# Patient Record
Sex: Male | Born: 1960 | Race: Black or African American | Hispanic: No | Marital: Single | Smoking: Current every day smoker
Health system: Southern US, Community
[De-identification: ages and names within clinical notes are randomized; demographics above are authoritative.]

## PROBLEM LIST (undated history)

## (undated) DIAGNOSIS — F32A Depression, unspecified: Secondary | ICD-10-CM

## (undated) DIAGNOSIS — K219 Gastro-esophageal reflux disease without esophagitis: Secondary | ICD-10-CM

## (undated) DIAGNOSIS — I10 Essential (primary) hypertension: Secondary | ICD-10-CM

## (undated) DIAGNOSIS — J310 Chronic rhinitis: Secondary | ICD-10-CM

## (undated) DIAGNOSIS — K117 Disturbances of salivary secretion: Secondary | ICD-10-CM

## (undated) DIAGNOSIS — F209 Schizophrenia, unspecified: Secondary | ICD-10-CM

## (undated) DIAGNOSIS — G2401 Drug induced subacute dyskinesia: Secondary | ICD-10-CM

## (undated) DIAGNOSIS — F0390 Unspecified dementia without behavioral disturbance: Secondary | ICD-10-CM

## (undated) DIAGNOSIS — R451 Restlessness and agitation: Secondary | ICD-10-CM

## (undated) DIAGNOSIS — E119 Type 2 diabetes mellitus without complications: Secondary | ICD-10-CM

## (undated) DIAGNOSIS — F329 Major depressive disorder, single episode, unspecified: Secondary | ICD-10-CM

## (undated) DIAGNOSIS — E785 Hyperlipidemia, unspecified: Secondary | ICD-10-CM

## (undated) HISTORY — DX: Gastro-esophageal reflux disease without esophagitis: K21.9

## (undated) HISTORY — DX: Major depressive disorder, single episode, unspecified: F32.9

## (undated) HISTORY — DX: Type 2 diabetes mellitus without complications: E11.9

## (undated) HISTORY — DX: Depression, unspecified: F32.A

## (undated) HISTORY — PX: APPENDECTOMY: SHX54

## (undated) HISTORY — DX: Schizophrenia, unspecified: F20.9

---

## 2009-10-26 ENCOUNTER — Encounter (HOSPITAL_COMMUNITY): Admission: RE | Admit: 2009-10-26 | Discharge: 2009-11-25 | Payer: Self-pay | Admitting: Family Medicine

## 2015-01-03 ENCOUNTER — Encounter (INDEPENDENT_AMBULATORY_CARE_PROVIDER_SITE_OTHER): Payer: Self-pay | Admitting: *Deleted

## 2015-01-12 ENCOUNTER — Ambulatory Visit (INDEPENDENT_AMBULATORY_CARE_PROVIDER_SITE_OTHER): Payer: Medicare Other | Admitting: Internal Medicine

## 2015-01-12 ENCOUNTER — Other Ambulatory Visit (INDEPENDENT_AMBULATORY_CARE_PROVIDER_SITE_OTHER): Payer: Self-pay | Admitting: Internal Medicine

## 2015-01-12 ENCOUNTER — Encounter (INDEPENDENT_AMBULATORY_CARE_PROVIDER_SITE_OTHER): Payer: Self-pay | Admitting: Internal Medicine

## 2015-01-12 ENCOUNTER — Telehealth (INDEPENDENT_AMBULATORY_CARE_PROVIDER_SITE_OTHER): Payer: Self-pay | Admitting: *Deleted

## 2015-01-12 VITALS — BP 94/60 | HR 60 | Temp 98.0°F | Ht 76.0 in | Wt 181.2 lb

## 2015-01-12 DIAGNOSIS — Z1211 Encounter for screening for malignant neoplasm of colon: Secondary | ICD-10-CM

## 2015-01-12 DIAGNOSIS — F259 Schizoaffective disorder, unspecified: Secondary | ICD-10-CM | POA: Insufficient documentation

## 2015-01-12 DIAGNOSIS — R1310 Dysphagia, unspecified: Secondary | ICD-10-CM

## 2015-01-12 DIAGNOSIS — F32A Depression, unspecified: Secondary | ICD-10-CM | POA: Insufficient documentation

## 2015-01-12 DIAGNOSIS — R634 Abnormal weight loss: Secondary | ICD-10-CM

## 2015-01-12 DIAGNOSIS — F250 Schizoaffective disorder, bipolar type: Secondary | ICD-10-CM

## 2015-01-12 DIAGNOSIS — E119 Type 2 diabetes mellitus without complications: Secondary | ICD-10-CM | POA: Insufficient documentation

## 2015-01-12 DIAGNOSIS — F329 Major depressive disorder, single episode, unspecified: Secondary | ICD-10-CM | POA: Diagnosis not present

## 2015-01-12 NOTE — Telephone Encounter (Signed)
Patient needs trilyte 

## 2015-01-12 NOTE — Patient Instructions (Signed)
EGD/ED, Colonoscopy. -The risks and benefits such as perforation, bleeding, and infection were reviewed with the patient and is agreeable. 

## 2015-01-12 NOTE — Progress Notes (Signed)
   Subjective:    Patient ID: Austin Mcdonald, male    DOB: [birth year], 54 y.o.   MRN: [id number]  HPI Referred by [facility] for dysphagia/screening colonoscopy. Patient has had weight loss. He has lost over 50 pounds in the past year per care. Patient is a resident of [facility]. He has been a resident at the home since 1984. Hx significant for schizophrenia. Patient has never undergone a colonoscopy. He says pork chops and chicken will lodge in his esophagus.  No problems eating breads. He will drink water for the bolus to go down.  His appetite is good per patient. There is no abdominal pain. He usually has a BM daily.  12/13/2014 H and H 12.7 and 39.9, MCV 93.4,platelet ct 181.  Review of Systems Past Medical History  Diagnosis Date  . Diabetes ([place])   . Schizophrenic disorder ([place])   . Depression     Past Surgical History  Procedure Laterality Date  . Appendectomy      No Known Allergies  No current outpatient prescriptions on file prior to visit.   No current facility-administered medications on file prior to visit.   Current Outpatient Prescriptions  Medication Sig Dispense Refill  . aspirin 81 MG tablet Take 81 mg by mouth daily.    . benztropine (COGENTIN) 2 MG tablet Take 2 mg by mouth 2 (two) times daily.    . citalopram (CELEXA) 20 MG tablet Take 20 mg by mouth daily.    . clonazePAM (KLONOPIN) 2 MG tablet Take 2 mg by mouth at bedtime.    . donepezil (ARICEPT) 10 MG tablet Take 10 mg by mouth at bedtime.    Marland Kitchen econazole nitrate 1 % cream Apply topically as needed.    . fluticasone (FLONASE) 50 MCG/ACT nasal spray Place into both nostrils daily.    Marland Kitchen loratadine (CLARITIN) 10 MG tablet Take 10 mg by mouth daily.    . metFORMIN (GLUCOPHAGE) 500 MG tablet Take by mouth 2 (two) times daily with a meal.    . paliperidone (INVEGA) 3 MG 24 hr tablet Take 3 mg by mouth daily.    . pravastatin (PRAVACHOL) 10 MG tablet Take 10 mg by mouth daily.     . risperidone (RISPERDAL) 4 MG tablet Take 4 mg by mouth 2 (two) times daily.    . traZODone (DESYREL) 100 MG tablet Take 100 mg by mouth at bedtime.     No current facility-administered medications for this visit.        Objective:   Physical Exam Blood pressure 94/60, pulse 60, temperature 98 F (36.7 C), height 6\' 4"  (1.93 m), weight 181 lb 3.2 oz (82.192 kg).  Alert and oriented. Skin warm and dry. Oral mucosa is moist.   . Sclera anicteric, conjunctivae is pink. Thyroid not enlarged. No cervical lymphadenopathy. Lungs clear. Heart regular rate and rhythm.  Abdomen is soft. Bowel sounds are positive. No hepatomegaly. No abdominal masses felt. No tenderness.  No edema to lower extremities.         Assessment & Plan:  Dysphagia. Stricture needs to be ruled out. Patient also has had weight loss. Screening colonoscopy

## 2015-01-16 MED ORDER — PEG 3350-KCL-NA BICARB-NACL 420 G PO SOLR: 4000.0000 mL | Freq: Once | ORAL | Status: DC

## 2015-02-23 ENCOUNTER — Encounter (HOSPITAL_COMMUNITY): Payer: Self-pay | Admitting: *Deleted

## 2015-02-23 ENCOUNTER — Encounter (HOSPITAL_COMMUNITY)
Admission: RE | Disposition: A | Discharge status: Home / Self Care | Payer: Self-pay | Source: Ambulatory Visit | Attending: Internal Medicine

## 2015-02-23 ENCOUNTER — Ambulatory Visit (HOSPITAL_COMMUNITY)
Admission: RE | Admit: 2015-02-23 | Discharge: 2015-02-23 | Disposition: A | Discharge status: Home / Self Care | Payer: Medicare Other | Source: Ambulatory Visit | Attending: Internal Medicine | Admitting: Internal Medicine

## 2015-02-23 DIAGNOSIS — Z79899 Other long term (current) drug therapy: Secondary | ICD-10-CM | POA: Insufficient documentation

## 2015-02-23 DIAGNOSIS — K2970 Gastritis, unspecified, without bleeding: Secondary | ICD-10-CM

## 2015-02-23 DIAGNOSIS — E119 Type 2 diabetes mellitus without complications: Secondary | ICD-10-CM | POA: Insufficient documentation

## 2015-02-23 DIAGNOSIS — K5730 Diverticulosis of large intestine without perforation or abscess without bleeding: Secondary | ICD-10-CM

## 2015-02-23 DIAGNOSIS — Z7951 Long term (current) use of inhaled steroids: Secondary | ICD-10-CM | POA: Insufficient documentation

## 2015-02-23 DIAGNOSIS — D120 Benign neoplasm of cecum: Secondary | ICD-10-CM | POA: Diagnosis not present

## 2015-02-23 DIAGNOSIS — K2950 Unspecified chronic gastritis without bleeding: Secondary | ICD-10-CM | POA: Insufficient documentation

## 2015-02-23 DIAGNOSIS — K648 Other hemorrhoids: Secondary | ICD-10-CM

## 2015-02-23 DIAGNOSIS — F329 Major depressive disorder, single episode, unspecified: Secondary | ICD-10-CM | POA: Insufficient documentation

## 2015-02-23 DIAGNOSIS — Z7982 Long term (current) use of aspirin: Secondary | ICD-10-CM | POA: Insufficient documentation

## 2015-02-23 DIAGNOSIS — R1310 Dysphagia, unspecified: Secondary | ICD-10-CM | POA: Insufficient documentation

## 2015-02-23 DIAGNOSIS — R634 Abnormal weight loss: Secondary | ICD-10-CM

## 2015-02-23 DIAGNOSIS — B9681 Helicobacter pylori [H. pylori] as the cause of diseases classified elsewhere: Secondary | ICD-10-CM | POA: Insufficient documentation

## 2015-02-23 DIAGNOSIS — K259 Gastric ulcer, unspecified as acute or chronic, without hemorrhage or perforation: Secondary | ICD-10-CM | POA: Insufficient documentation

## 2015-02-23 DIAGNOSIS — Z1211 Encounter for screening for malignant neoplasm of colon: Secondary | ICD-10-CM

## 2015-02-23 DIAGNOSIS — Z6821 Body mass index (BMI) 21.0-21.9, adult: Secondary | ICD-10-CM | POA: Diagnosis not present

## 2015-02-23 DIAGNOSIS — F209 Schizophrenia, unspecified: Secondary | ICD-10-CM | POA: Diagnosis not present

## 2015-02-23 DIAGNOSIS — Z7984 Long term (current) use of oral hypoglycemic drugs: Secondary | ICD-10-CM | POA: Diagnosis not present

## 2015-02-23 HISTORY — PX: COLONOSCOPY: SHX5424

## 2015-02-23 HISTORY — PX: ESOPHAGOGASTRODUODENOSCOPY: SHX5428

## 2015-02-23 HISTORY — PX: ESOPHAGEAL DILATION: SHX303

## 2015-02-23 LAB — GLUCOSE, CAPILLARY
GLUCOSE-CAPILLARY: 113 mg/dL — AB (ref 65–99)
Glucose-Capillary: 64 mg/dL — ABNORMAL LOW (ref 65–99)

## 2015-02-23 SURGERY — EGD (ESOPHAGOGASTRODUODENOSCOPY)
Anesthesia: Moderate Sedation

## 2015-02-23 MED ORDER — HYDROCORTISONE 2.5 % RE CREA: 1.0000 "application " | TOPICAL_CREAM | Freq: Two times a day (BID) | RECTAL | Status: DC

## 2015-02-23 MED ORDER — SODIUM CHLORIDE 0.9 % IV SOLN: INTRAVENOUS | Status: DC

## 2015-02-23 MED ORDER — BUTAMBEN-TETRACAINE-BENZOCAINE 2-2-14 % EX AERO
INHALATION_SPRAY | CUTANEOUS | Status: DC | PRN
  Administered 2015-02-23: 2 via TOPICAL

## 2015-02-23 MED ORDER — MEPERIDINE HCL 50 MG/ML IJ SOLN
INTRAMUSCULAR | Status: DC | PRN
  Administered 2015-02-23 (×2): 25 mg

## 2015-02-23 MED ORDER — MEPERIDINE HCL 50 MG/ML IJ SOLN
INTRAMUSCULAR | Status: AC
  Filled 2015-02-23: qty 1

## 2015-02-23 MED ORDER — STERILE WATER FOR IRRIGATION IR SOLN
Status: DC | PRN
  Administered 2015-02-23: 14:00:00

## 2015-02-23 MED ORDER — MIDAZOLAM HCL 5 MG/5ML IJ SOLN
INTRAMUSCULAR | Status: AC
  Filled 2015-02-23: qty 10

## 2015-02-23 MED ORDER — MIDAZOLAM HCL 5 MG/5ML IJ SOLN
INTRAMUSCULAR | Status: DC | PRN
  Administered 2015-02-23: 1 mg via INTRAVENOUS
  Administered 2015-02-23 (×2): 2 mg via INTRAVENOUS
  Administered 2015-02-23: 1 mg via INTRAVENOUS

## 2015-02-23 MED ORDER — PANTOPRAZOLE SODIUM 40 MG PO TBEC: 40.0000 mg | DELAYED_RELEASE_TABLET | Freq: Every day | ORAL | Status: DC

## 2015-02-23 MED ORDER — DEXTROSE-NACL 5-0.9 % IV SOLN
INTRAVENOUS | Status: DC
  Administered 2015-02-23: 1000 mL via INTRAVENOUS

## 2015-02-23 NOTE — Op Note (Signed)
EGD PROCEDURE REPORT  PATIENT:  Austin Mcdonald  MR#:  [id number] Birthdate:  September 01, 1960, 54 y.o., male Endoscopist:  Dr. Rogene Houston, MD  Procedure Date: 02/23/2015  Procedure:   EGD ED & Colonoscopy  Indications:  Patient is 54 year old African-American male with history of esophageal depression and diabetes mellitus who presents with 2 year history of intermittent solid food dysphagia and also gives history of weight loss. It is unclear weight loss is due to dysphagia or other reasons. He is also undergoing average risk screening colonoscopy.            Informed Consent:  The risks, benefits, alternatives & imponderables which include, but are not limited to, bleeding, infection, perforation, drug reaction and potential missed lesion have been reviewed.  The potential for biopsy, lesion removal, esophageal dilation, etc. have also been discussed.  Questions have been answered.  All parties agreeable.  Please see history & physical in medical record for more information.  Medications:  Demerol 50 mg IV Versed 6 mg IV Cetacaine spray topically for oropharyngeal anesthesia  EGD  Description of procedure:  The endoscope was introduced through the mouth and advanced to the second portion of the duodenum without difficulty or limitations. The mucosal surfaces were surveyed very carefully during advancement of the scope and upon withdrawal.  Findings:  Esophagus:  Mucosa of the esophagus was normal. GE junction was wide open without ring or stricture formation. GEJ:  44 cm Stomach:  There was small amount of bile in the stomach. It distended very well with insufflation. Folds in the proximal stomach were unremarkable. Examination mucosa at gastric body revealed erythema edema and multiple erosions. 4 mm ulcer noted at antrum towards greater curvature with clean base. Pyloric channel was patent. Erosions and scarring noted to mucosa at angularis. Mucosa and fundus and cardia was  normal. Duodenum:  Normal bulbar and post bulbar mucosa.  Therapeutic/Diagnostic Maneuvers Performed: Esophagus was dilated by passing 56 [place] Maloney dilator to full insertion. As the dilator was withdrawn endoscope was passed again and no disruption noted to esophageal mucosa. Multiple biopsies are taken from mucosa at gastric body and endoscope was withdrawn.   COLONOSCOPY Description of procedure:  After a digital rectal exam was performed, that colonoscope was advanced from the anus through the rectum and colon to the area of the cecum, ileocecal valve and appendiceal orifice. The cecum was deeply intubated. These structures were well-seen and photographed for the record. From the level of the cecum and ileocecal valve, the scope was slowly and cautiously withdrawn. The mucosal surfaces were carefully surveyed utilizing scope tip to flexion to facilitate fold flattening as needed. The scope was pulled down into the rectum where a thorough exam including retroflexion was performed.  Findings:   Prep satisfactory. Small cecal polyp ablated via cold biopsy. Redundant colon with small diverticulum at hepatic flexure and another one at sigmoid colon. Normal rectal mucosa. Prominent hemorrhoids noted above the dentate line along with smaller hemorrhoids below the dentate line.   Therapeutic/Diagnostic Maneuvers Performed:  See above  Complications:  None  Cecal Withdrawal Time:  9 minutes  EBL: Minimal  Impression:  EGD findings: No evidence of esophageal stricture or ring formation. Gastritis and body and antrum with multiple erosions and a small antral ulcer. Multiple biopsies taken from mucosa at gastric body for routine histology. Esophagus dilated by passing 56 [place] Maloney dilator but no disruption noted to esophageal mucosa.  Comment: Since no structural abnormality noted to esophagus, we may  be dealing with esophageal motility disorder. Need for further workup will  depend on his clinical course  Colonoscopy findings: Examination performed to cecum. Redundant colon. Small cecal polyp ablated via cold biopsy. Two small diverticula noted as above. Prominent internal hemorrhoids with small external hemorrhoids.  Recommendations:  Standard instructions given. Patient advised not to take OTC NSAIDs other than low-dose aspirin. Pantoprazole 40 mg by mouth every morning. Proctosol HC 2.5% to be applied to anal canal twice a day for 2 weeks. I will be contacting patient with results of biopsy. Follow-up in 8 weeks regarding dysphagia and weight loss.  Nikyla Navedo U  02/23/2015 3:06 PM  CC: Dr. Alison Stalling [facility] & Dr. Rayne Du ref. provider found

## 2015-02-23 NOTE — Discharge Instructions (Signed)
Resume usual medications and high fiber diet. Pantoprazole 40 mg by mouth 30 minutes before breakfast daily. Proctosol HC 2.5% cream to be applied to anal canal twice daily for 2 weeks. No driving for 24 hours. Physician will call with biopsy results. Office visit in 2 months.   Colonoscopy, Care After These instructions give you information on caring for yourself after your procedure. Your doctor may also give you more specific instructions. Call your doctor if you have any problems or questions after your procedure. HOME CARE  Do not drive for 24 hours.  Do not sign important papers or use machinery for 24 hours.  You may shower.  You may go back to your usual activities, but go slower for the first 24 hours.  Take rest breaks often during the first 24 hours.  Walk around or use warm packs on your belly (abdomen) if you have belly cramping or gas.  Drink enough fluids to keep your pee (urine) clear or pale yellow.  Resume your normal diet. Avoid heavy or fried foods.  Avoid drinking alcohol for 24 hours or as told by your doctor.  Only take medicines as told by your doctor. If a tissue sample (biopsy) was taken during the procedure:   Do not take aspirin or blood thinners for 7 days, or as told by your doctor.  Do not drink alcohol for 7 days, or as told by your doctor.  Eat soft foods for the first 24 hours. GET HELP IF: You still have a small amount of blood in your poop (stool) 2-3 days after the procedure. GET HELP RIGHT AWAY IF:  You have more than a small amount of blood in your poop.  You see clumps of tissue (blood clots) in your poop.  Your belly is puffy (swollen).  You feel sick to your stomach (nauseous) or throw up (vomit).  You have a fever.  You have belly pain that gets worse and medicine does not help. MAKE SURE YOU:  Understand these instructions.  Will watch your condition.  Will get help right away if you are not doing well or get  worse.   This information is not intended to replace advice given to you by your health care provider. Make sure you discuss any questions you have with your health care provider.   Document Released: 04/13/2010 Document Revised: 03/16/2013 Document Reviewed: 11/16/2012 Elsevier Interactive Patient Education 2016 [place]. Esophagogastroduodenoscopy, Care After Refer to this sheet in the next few weeks. These instructions provide you with information about caring for yourself after your procedure. Your health care provider may also give you more specific instructions. Your treatment has been planned according to current medical practices, but problems sometimes occur. Call your health care provider if you have any problems or questions after your procedure. WHAT TO EXPECT AFTER THE PROCEDURE After your procedure, it is typical to feel:  Soreness in your throat.  Pain with swallowing.  Sick to your stomach (nauseous).  Bloated.  Dizzy.  Fatigued. HOME CARE INSTRUCTIONS  Do not eat or drink anything until the numbing medicine (local anesthetic) has worn off and your gag reflex has returned. You will know that the local anesthetic has worn off when you can swallow comfortably.  Do not drive or operate machinery until directed by your health care provider.  Take medicines only as directed by your health care provider. SEEK MEDICAL CARE IF:   You cannot stop coughing.  You are not urinating at all or less than  usual. SEEK IMMEDIATE MEDICAL CARE IF:  You have difficulty swallowing.  You cannot eat or drink.  You have worsening throat or chest pain.  You have dizziness or lightheadedness or you faint.  You have nausea or vomiting.  You have chills.  You have a fever.  You have severe abdominal pain.  You have black, tarry, or bloody stools.   This information is not intended to replace advice given to you by your health care provider. Make sure you discuss any  questions you have with your health care provider.   Document Released: 02/26/2012 Document Revised: 04/01/2014 Document Reviewed: 02/26/2012 Elsevier Interactive Patient Education 2016 Else[employer] Inc. Esophageal Dilatation Esophageal dilatation is a procedure to open a blocked or narrowed part of the esophagus. The esophagus is the long tube in your throat that carries food and liquid from your mouth to your stomach. The procedure is also called esophageal dilation.  You may need this procedure if you have a buildup of scar tissue in your esophagus that makes it difficult, painful, or even impossible to swallow. This can be caused by gastroesophageal reflux disease (GERD). In rare cases, people need this procedure because they have cancer of the esophagus or a problem with the way food moves through the esophagus. Sometimes you may need to have another dilatation to enlarge the opening of the esophagus gradually. LET [facility] CARE PROVIDER KNOW ABOUT:   Any allergies you have.  All medicines you are taking, including vitamins, herbs, eye drops, creams, and over-the-counter medicines.  Previous problems you or members of your family have had with the use of anesthetics.  Any blood disorders you have.  Previous surgeries you have had.  Medical conditions you have.  Any antibiotic medicines you are required to take before dental procedures. RISKS AND COMPLICATIONS Generally, this is a safe procedure. However, problems can occur and include:  Bleeding from a tear in the lining of the esophagus.  A hole (perforation) in the esophagus. BEFORE THE PROCEDURE  Do not eat or drink anything after midnight on the night before the procedure or as directed by your health care provider.  Ask your health care provider about changing or stopping your regular medicines. This is especially important if you are taking diabetes medicines or blood thinners.  Plan to have someone take you home after  the procedure. PROCEDURE   You will be given a medicine that makes you relaxed and sleepy (sedative).  A medicine may be sprayed or gargled to numb the back of the throat.  Your health care provider can use various instruments to do an esophageal dilatation. During the procedure, the instrument used will be placed in your mouth and passed down into your esophagus. Options include:  Simple dilators. This instrument is carefully placed in the esophagus to stretch it.  Guided wire bougies. In this method, a flexible tube (endoscope) is used to insert a wire into the esophagus. The dilator is passed over this wire to enlarge the esophagus. Then the wire is removed.  Balloon dilators. An endoscope with a small balloon at the end is passed down into the esophagus. Inflating the balloon gently stretches the esophagus and opens it up. AFTER THE PROCEDURE  Your blood pressure, heart rate, breathing rate, and blood oxygen level will be monitored often until the medicines you were given have worn off.  Your throat may feel slightly sore and will probably still feel numb. This will improve slowly over time.  You will not be  allowed to eat or drink until the throat numbness has resolved.  If this is a same-day procedure, you may be allowed to go home once you have been able to drink, urinate, and sit on the edge of the bed without nausea or dizziness.  If this is a same-day procedure, you should have a friend or family member with you for the next 24 hours after the procedure.   This information is not intended to replace advice given to you by your health care provider. Make sure you discuss any questions you have with your health care provider.   Document Released: 05/02/2005 Document Revised: 04/01/2014 Document Reviewed: 07/21/2013 Elsevier Interactive Patient Education 2016 Elsevier Inc. High-Fiber Diet Fiber, also called dietary fiber, is a type of carbohydrate found in fruits, vegetables,  whole grains, and beans. A high-fiber diet can have many health benefits. Your health care provider may recommend a high-fiber diet to help:  Prevent constipation. Fiber can make your bowel movements more regular.  Lower your cholesterol.  Relieve hemorrhoids, uncomplicated diverticulosis, or irritable bowel syndrome.  Prevent overeating as part of a weight-loss plan.  Prevent heart disease, type 2 diabetes, and certain cancers. WHAT IS MY PLAN? The recommended daily intake of fiber includes:  38 grams for men under age 20.  49 grams for men over age 1.  27 grams for women under age 92.  38 grams for women over age 11. You can get the recommended daily intake of dietary fiber by eating a variety of fruits, vegetables, grains, and beans. Your health care provider may also recommend a fiber supplement if it is not possible to get enough fiber through your diet. WHAT DO I NEED TO KNOW ABOUT A HIGH-FIBER DIET?  Fiber supplements have not been widely studied for their effectiveness, so it is better to get fiber through food sources.  Always check the fiber content on thenutrition facts label of any prepackaged food. Look for foods that contain at least 5 grams of fiber per serving.  Ask your dietitian if you have questions about specific foods that are related to your condition, especially if those foods are not listed in the following section.  Increase your daily fiber consumption gradually. Increasing your intake of dietary fiber too quickly may cause bloating, cramping, or gas.  Drink plenty of water. Water helps you to digest fiber. WHAT FOODS CAN I EAT? Grains Whole-grain breads. Multigrain cereal. Oats and oatmeal. Brown rice. Barley. Bulgur wheat. [place]. Bran muffins. Popcorn. Rye wafer crackers. Vegetables Sweet potatoes. Spinach. Kale. Artichokes. Cabbage. Broccoli. Green peas. Carrots. Squash. Fruits Berries. Pears. Apples. Oranges. Avocados. Prunes and raisins. Dried  figs. Meats and Other Protein Sources [place], kidney, pinto, and soy beans. Split peas. Lentils. Nuts and seeds. Dairy Fiber-fortified yogurt. Beverages Fiber-fortified soy milk. Fiber-fortified orange juice. Other Fiber bars. The items listed above may not be a complete list of recommended foods or beverages. Contact your dietitian for more options. WHAT FOODS ARE NOT RECOMMENDED? Grains White bread. Pasta made with refined flour. White rice. Vegetables Fried potatoes. Canned vegetables. Well-cooked vegetables.  Fruits Fruit juice. Cooked, strained fruit. Meats and Other Protein Sources Fatty cuts of meat. Fried Sales executive or fried fish. Dairy Milk. Yogurt. Cream cheese. Sour cream. Beverages Soft drinks. Other Cakes and pastries. Butter and oils. The items listed above may not be a complete list of foods and beverages to avoid. Contact your dietitian for more information. WHAT ARE SOME TIPS FOR INCLUDING HIGH-FIBER FOODS IN MY DIET?  Eat  a wide variety of high-fiber foods.  Make sure that half of all grains consumed each day are whole grains.  Replace breads and cereals made from refined flour or white flour with whole-grain breads and cereals.  Replace white rice with brown rice, bulgur wheat, or millet.  Start the day with a breakfast that is high in fiber, such as a cereal that contains at least 5 grams of fiber per serving.  Use beans in place of meat in soups, salads, or pasta.  Eat high-fiber snacks, such as berries, raw vegetables, nuts, or popcorn.   This information is not intended to replace advice given to you by your health care provider. Make sure you discuss any questions you have with your health care provider.   Document Released: 03/11/2005 Document Revised: 04/01/2014 Document Reviewed: 08/24/2013 Elsevier Interactive Patient Education [employer].

## 2015-02-23 NOTE — H&P (Signed)
Austin Mcdonald is an 54 y.o. male.   Chief Complaint: Patient is here for EGD, ED and colonoscopy. HPI: Patient is 54 year old African-American male who presents with 2 year history of intermittent solid food dysphagia. He denies heartburn. He had few episodes of food impaction relieved with regurgitation. Most of the time he is able to push the food bolus down by drinking sips of water. He has lost several pounds this year. He is not showing weight loss is due to his dysphagia. He denies abdominal pain melena or rectal bleeding. He is undergoing diagnostic and therapeutic EGD and colonoscopy. Patient states he did undergo esophageal dilation either in [place] or [facility] few years back. Family history is negative for CRC.  Past Medical History  Diagnosis Date  . Diabetes ([place])   . Schizophrenic disorder ([place])   . Depression     Past Surgical History  Procedure Laterality Date  . Appendectomy      History reviewed. No pertinent family history. Social History:  reports that he has been smoking Cigarettes.  He has been smoking about 0.50 packs per day. He does not have any smokeless tobacco history on file. He reports that he does not drink alcohol or use illicit drugs.  Allergies: No Known Allergies  Medications Prior to Admission  Medication Sig Dispense Refill  . aspirin 81 MG tablet Take 81 mg by mouth daily.    . benztropine (COGENTIN) 2 MG tablet Take 2 mg by mouth 2 (two) times daily.    . citalopram (CELEXA) 20 MG tablet Take 20 mg by mouth daily.    . clonazePAM (KLONOPIN) 2 MG tablet Take 2 mg by mouth at bedtime.    . donepezil (ARICEPT) 10 MG tablet Take 10 mg by mouth at bedtime.    Marland Kitchen econazole nitrate 1 % cream Apply 1 application topically daily.     . fluticasone (FLONASE) 50 MCG/ACT nasal spray Place 1 spray into both nostrils daily.     Marland Kitchen loratadine (CLARITIN) 10 MG tablet Take 10 mg by mouth daily.    . metFORMIN (GLUCOPHAGE) 500 MG tablet  Take 500 mg by mouth 2 (two) times daily with a meal.     . paliperidone (INVEGA) 3 MG 24 hr tablet Take 3 mg by mouth daily.    . polyethylene glycol-electrolytes (NULYTELY/GOLYTELY) 420 G solution Take 4,000 mLs by mouth once. 4000 mL 0  . pravastatin (PRAVACHOL) 10 MG tablet Take 10 mg by mouth daily.    . risperidone (RISPERDAL) 4 MG tablet Take 4 mg by mouth 2 (two) times daily.    . traZODone (DESYREL) 100 MG tablet Take 100 mg by mouth at bedtime.      Results for orders placed or performed during the hospital encounter of 02/23/15 (from the past 48 hour(s))  Glucose, capillary     Status: Abnormal   Collection Time: 02/23/15  1:23 PM  Result Value Ref Range   Glucose-Capillary 64 (L) 65 - 99 mg/dL   No results found.  ROS  Blood pressure 112/72, pulse 62, temperature 97.6 F (36.4 C), temperature source Oral, resp. rate 12, height 6\' 2"  (1.88 m), weight 170 lb (77.111 kg), SpO2 100 %. Physical Exam  Constitutional:   Well-developed thin African-American male in no acute distress. He has speech impairment.  HENT:  Mouth/Throat: Oropharynx is clear and moist.  Eyes: Conjunctivae are normal. No scleral icterus.  Neck: No thyromegaly present.  Cardiovascular: Normal rate, regular rhythm and normal  heart sounds.   No murmur heard. Respiratory: Effort normal and breath sounds normal.  GI: Soft. He exhibits no distension. There is tenderness.  Musculoskeletal: He exhibits no edema.  Lymphadenopathy:    He has no cervical adenopathy.  Neurological: He is alert.  Skin: Skin is warm and dry.     Assessment/Plan Solid food dysphagia. Weight loss. EGD with EGD and average risk screening colonoscopy.  Austin Mcdonald U 02/23/2015, 1:55 PM

## 2015-03-01 ENCOUNTER — Encounter (HOSPITAL_COMMUNITY): Payer: Self-pay | Admitting: Internal Medicine

## 2015-03-02 ENCOUNTER — Telehealth (INDEPENDENT_AMBULATORY_CARE_PROVIDER_SITE_OTHER): Payer: Self-pay | Admitting: Internal Medicine

## 2015-03-02 MED ORDER — BIS SUBCIT-METRONID-TETRACYC 140-125-125 MG PO CAPS: 3.0000 | ORAL_CAPSULE | Freq: Three times a day (TID) | ORAL | Status: DC

## 2015-03-02 NOTE — Telephone Encounter (Signed)
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 Results reviewed with [facility] at rest home. She told me patient is able to swallow much better. Will send prescription for Pylera to his pharmacy. Next colonoscopy in 7 years.

## 2015-03-03 NOTE — Telephone Encounter (Signed)
See other note

## 2015-03-13 ENCOUNTER — Telehealth (INDEPENDENT_AMBULATORY_CARE_PROVIDER_SITE_OTHER): Payer: Self-pay | Admitting: *Deleted

## 2015-03-13 NOTE — Telephone Encounter (Signed)
Antibiotic Dr Laural Golden sent to pharmacy isn't cover by insurance, can he have something different

## 2015-03-13 NOTE — Telephone Encounter (Signed)
We have samples of Pylera. A mrs. Austin Mcdonald will come by the office 03/14/15 and pick this up for the patient.

## 2015-04-26 ENCOUNTER — Ambulatory Visit (INDEPENDENT_AMBULATORY_CARE_PROVIDER_SITE_OTHER): Payer: Medicare Other | Admitting: Internal Medicine

## 2015-05-10 ENCOUNTER — Ambulatory Visit (INDEPENDENT_AMBULATORY_CARE_PROVIDER_SITE_OTHER): Payer: Medicare Other | Admitting: Internal Medicine

## 2015-05-10 ENCOUNTER — Encounter (INDEPENDENT_AMBULATORY_CARE_PROVIDER_SITE_OTHER): Payer: Self-pay | Admitting: Internal Medicine

## 2015-05-10 VITALS — BP 112/78 | HR 64 | Temp 98.2°F | Ht 76.0 in | Wt 180.8 lb

## 2015-05-10 DIAGNOSIS — R1319 Other dysphagia: Secondary | ICD-10-CM

## 2015-05-10 DIAGNOSIS — B9681 Helicobacter pylori [H. pylori] as the cause of diseases classified elsewhere: Secondary | ICD-10-CM

## 2015-05-10 DIAGNOSIS — D369 Benign neoplasm, unspecified site: Secondary | ICD-10-CM

## 2015-05-10 DIAGNOSIS — A048 Other specified bacterial intestinal infections: Secondary | ICD-10-CM

## 2015-05-10 NOTE — Progress Notes (Signed)
Subjective:    Patient ID: Austin Mcdonald, male    DOB: November 26, 1960, 55 y.o.   MRN: [id number]  HPI Here today for f/u after undergoing and EGD/ED in December for dysphagia. He also underwent a screening colonoscopy. Found to be H. Pylori positive and covered with Pylera.  He is a resident of a group home.  Caregiver says when he eats it is noisy. He says he can eat anything he wants now.  He says his swallowing is better. He does have problems eating pork chops and ribs.  His appetite is good. He has maintained his weight. Last weight in October was 1890. Today his weight is 180.8.  His BMs are normal he has one stool a day.     02/23/2015 EGD ED & Colonoscopy  Indications: Patient is 55 year old African-American male with history of esophageal depression and diabetes mellitus who presents with 2 year history of intermittent solid food dysphagia and also gives history of weight loss. It is unclear weight loss is due to dysphagia or other reasons. He is also undergoing average risk screening colonoscopy. EGD findings: No evidence of esophageal stricture or ring formation. Gastritis and body and antrum with multiple erosions and a small antral ulcer. Multiple biopsies taken from mucosa at gastric body for routine histology. Esophagus dilated by passing 56 French Maloney dilator but no disruption noted to esophageal mucosa.  Comment: Since no structural abnormality noted to esophagus, we may be dealing with esophageal motility disorder. Need for further workup will depend on his clinical course  Colonoscopy findings: Examination performed to cecum. Redundant colon. Small cecal polyp ablated via cold biopsy. Two small diverticula noted as above. Prominent internal hemorrhoids with small external hemorrhoids. Biopsy: Tubular adenoma.   Biopsy results reviewed with  Everlene Farrier at rest home. Patient is able to swallow better. Will treat H. pylori infection with Pylera for 10 days. Patient is on pantoprazole. Next colonoscopy in 7 years.  Review of Systems Past Medical History  Diagnosis Date  . Diabetes ([place])   . Schizophrenic disorder ([place])   . Depression     Past Surgical History  Procedure Laterality Date  . Appendectomy    . Esophagogastroduodenoscopy N/A 02/23/2015    Procedure: ESOPHAGOGASTRODUODENOSCOPY (EGD);  Surgeon: Rogene Houston, MD;  [facility];  Service: Endoscopy;  Laterality: N/A;  2:00  . Esophageal dilation N/A 02/23/2015    Procedure: ESOPHAGEAL DILATION;  Surgeon: Rogene Houston, MD;  [facility]: [facility];  Service: Endoscopy;  Laterality: N/A;  . Colonoscopy N/A 02/23/2015    Procedure: COLONOSCOPY;  Surgeon: Rogene Houston, MD;  [facility]: [facility];  Service: Endoscopy;  Laterality: N/A;    No Known Allergies  Current Outpatient Prescriptions on File Prior to Visit  Medication Sig Dispense Refill  . aspirin 81 MG tablet Take 81 mg by mouth daily.    . benztropine (COGENTIN) 2 MG tablet Take 2 mg by mouth 2 (two) times daily.    . citalopram (CELEXA) 20 MG tablet Take 20 mg by mouth daily.    . clonazePAM (KLONOPIN) 2 MG tablet Take 2 mg by mouth at bedtime.    . donepezil (ARICEPT) 10 MG tablet Take 10 mg by mouth at bedtime.    Marland Kitchen econazole nitrate 1 % cream Apply 1 application topically daily.     . fluticasone (FLONASE) 50 MCG/ACT nasal spray Place 1 spray into both nostrils daily.     Marland Kitchen loratadine (CLARITIN) 10 MG tablet Take 10  mg by mouth daily.    . metFORMIN (GLUCOPHAGE) 500 MG tablet Take 500 mg by mouth 2 (two) times daily with a meal.     . paliperidone (INVEGA) 3 MG 24 hr tablet Take 3 mg by mouth daily.    . pantoprazole (PROTONIX) 40 MG tablet Take 1 tablet (40 mg total) by mouth daily before breakfast. 30 tablet 5  . pravastatin (PRAVACHOL) 10 MG tablet Take 10 mg by mouth daily.    .  risperidone (RISPERDAL) 4 MG tablet Take 4 mg by mouth 2 (two) times daily.    . traZODone (DESYREL) 100 MG tablet Take 100 mg by mouth at bedtime.     No current facility-administered medications on file prior to visit.        Objective:   Physical Exam Blood pressure 112/78, pulse 64, temperature 98.2 F (36.8 C), height 6\' 4"  (1.93 m), weight 180 lb 12.8 oz (82.01 kg).  Alert and oriented. Skin warm and dry. Oral mucosa is moist.   . Sclera anicteric, conjunctivae is pink. Thyroid not enlarged. No cervical lymphadenopathy. Lungs clear. Heart regular rate and rhythm.  Abdomen is soft. Bowel sounds are positive. No hepatomegaly. No abdominal masses felt. No tenderness.  No edema to lower extremities.          Assessment & Plan:  Dysphagia. His swallowing is much better. He does have some problems with pork chops and ribs. He will continue the Protonix. OV in 1 year.

## 2015-05-10 NOTE — Patient Instructions (Signed)
He will continue the Protonix.  OV 1 year.

## 2015-06-27 ENCOUNTER — Other Ambulatory Visit (INDEPENDENT_AMBULATORY_CARE_PROVIDER_SITE_OTHER): Payer: Self-pay | Admitting: Internal Medicine

## 2016-03-29 ENCOUNTER — Encounter (INDEPENDENT_AMBULATORY_CARE_PROVIDER_SITE_OTHER): Payer: Self-pay

## 2016-03-29 ENCOUNTER — Encounter (INDEPENDENT_AMBULATORY_CARE_PROVIDER_SITE_OTHER): Payer: Self-pay | Admitting: Internal Medicine

## 2016-05-09 ENCOUNTER — Encounter (INDEPENDENT_AMBULATORY_CARE_PROVIDER_SITE_OTHER): Payer: Self-pay | Admitting: Internal Medicine

## 2016-05-09 ENCOUNTER — Ambulatory Visit (INDEPENDENT_AMBULATORY_CARE_PROVIDER_SITE_OTHER): Payer: Medicare Other | Admitting: Internal Medicine

## 2016-05-09 DIAGNOSIS — K219 Gastro-esophageal reflux disease without esophagitis: Secondary | ICD-10-CM | POA: Insufficient documentation

## 2016-05-09 HISTORY — DX: Gastro-esophageal reflux disease without esophagitis: K21.9

## 2016-05-09 NOTE — Patient Instructions (Signed)
OV in 1 year.  

## 2016-05-09 NOTE — Progress Notes (Signed)
Subjective:    Patient ID: Austin Mcdonald, male    DOB: [birth year], 56 y.o.   MRN: [id number]  HPI  Last weight in February 2017 180 Here today for f/u. Underwent an EGD/ED in December of 2016 for dysphagia. Also underwent a screening colonoscopy.  He was found to be H. pylori positive and covered with Pylera. Resident of a group home. He tells me he is doing alright. Appetite is good.  He has gained about 10 pounds since his last visit.  Does have some problems sometimes eating pork chops.  He chews his food well.  Heart burn controlled with Protonix.  He has a BM x 1 a day and sometimes 2.  No melena or BRRB. He is exercising by walking in yard.     02/23/2015   EGD ED & Colonoscopy   Indications:  Patient is 56 year old African-American male with history of esophageal depression and diabetes mellitus who presents with 2 year history of intermittent solid food dysphagia and also gives history of weight loss. It is unclear weight loss is due to dysphagia or other reasons. He is also undergoing average risk screening colonoscopy.                                                                                                                         EGD findings: No evidence of esophageal stricture or ring formation. Gastritis and body and antrum with multiple erosions and a small antral ulcer. Multiple biopsies taken from mucosa at gastric body for routine histology. Esophagus dilated by passing 56 French Maloney dilator but no disruption noted to esophageal mucosa.  Biopsy: Chronic gastritis with H. pylori and intestinal metaplasia. No dysplasia or malignancy,.  Colonoscopy:   Small cecal polyp ablated via cold biopsy. Redundant colon with small diverticulum at hepatic flexure and another one at sigmoid colon. Normal rectal mucosa. Prominent hemorrhoids noted above the dentate line along with smaller hemorrhoids below the dentate line. Biopsy: Tubular adenoma; No high grade dysplasia  or malignancy    Review of Systems Past Medical History:  Diagnosis Date  . Depression   . Diabetes ([place])   . GERD (gastroesophageal reflux disease) 05/09/2016  . Schizophrenic disorder [facility])     Past Surgical History:  Procedure Laterality Date  . APPENDECTOMY    . COLONOSCOPY N/A 02/23/2015   Procedure: COLONOSCOPY;  Surgeon: Rogene Houston, MD;  [facility];  Service: Endoscopy;  Laterality: N/A;  . ESOPHAGEAL DILATION N/A 02/23/2015   Procedure: ESOPHAGEAL DILATION;  Surgeon: Rogene Houston, MD;  [facility];  Service: Endoscopy;  Laterality: N/A;  . ESOPHAGOGASTRODUODENOSCOPY N/A 02/23/2015   Procedure: ESOPHAGOGASTRODUODENOSCOPY (EGD);  Surgeon: Rogene Houston, MD;  [facility];  Service: Endoscopy;  Laterality: N/A;  2:00    No Known Allergies  Current Outpatient Prescriptions on File Prior to Visit  Medication Sig Dispense Refill  . aspirin 81 MG tablet Take 81 mg by mouth daily.    Marland Kitchen  benztropine (COGENTIN) 2 MG tablet Take 2 mg by mouth 2 (two) times daily.    . citalopram (CELEXA) 20 MG tablet Take 20 mg by mouth daily.    . clonazePAM (KLONOPIN) 2 MG tablet Take 2 mg by mouth at bedtime.    . donepezil (ARICEPT) 10 MG tablet Take 10 mg by mouth at bedtime.    Marland Kitchen econazole nitrate 1 % cream Apply 1 application topically daily.     . fluticasone (FLONASE) 50 MCG/ACT nasal spray Place 1 spray into both nostrils daily.     Marland Kitchen loratadine (CLARITIN) 10 MG tablet Take 10 mg by mouth daily.    . metFORMIN (GLUCOPHAGE) 500 MG tablet Take 500 mg by mouth 2 (two) times daily with a meal.     . paliperidone (INVEGA) 3 MG 24 hr tablet Take 3 mg by mouth daily.    . pantoprazole (PROTONIX) 40 MG tablet TAKE 1 TABLET IN THE MORNING BEFORE BREAKFAST 28 tablet 5  . pravastatin (PRAVACHOL) 10 MG tablet Take 10 mg by mouth daily.    . risperidone (RISPERDAL) 4 MG tablet Take 4 mg by mouth 2 (two) times daily.    . traZODone (DESYREL) 100 MG tablet  Take 100 mg by mouth at bedtime.     No current facility-administered medications on file prior to visit.        Objective:   Physical Exam Blood pressure 130/90, pulse 60, temperature 98.5 F (36.9 C), height 6' (1.829 m), weight 190 lb 11.2 oz (86.5 kg). Alert and oriented. Skin warm and dry. Oral mucosa is moist.   . Sclera anicteric, conjunctivae is pink. Thyroid not enlarged. No cervical lymphadenopathy. Lungs clear. Heart regular rate and rhythm.  Abdomen is soft. Bowel sounds are positive. No hepatomegaly. No abdominal masses felt. No tenderness.  No edema to lower extremities.          Assessment & Plan:  Dysphagia. No trouble swallowing. GERD controlled with Protonix. OV in 1 year.

## 2016-07-08 ENCOUNTER — Encounter (INDEPENDENT_AMBULATORY_CARE_PROVIDER_SITE_OTHER): Payer: Self-pay

## 2016-07-08 ENCOUNTER — Ambulatory Visit (INDEPENDENT_AMBULATORY_CARE_PROVIDER_SITE_OTHER): Payer: Medicare Other | Admitting: Internal Medicine

## 2016-07-08 ENCOUNTER — Encounter (INDEPENDENT_AMBULATORY_CARE_PROVIDER_SITE_OTHER): Payer: Self-pay | Admitting: Internal Medicine

## 2016-07-08 ENCOUNTER — Encounter (INDEPENDENT_AMBULATORY_CARE_PROVIDER_SITE_OTHER): Payer: Self-pay | Admitting: *Deleted

## 2016-07-08 VITALS — BP 124/72 | HR 76 | Temp 98.2°F | Ht 72.0 in | Wt 190.8 lb

## 2016-07-08 DIAGNOSIS — R197 Diarrhea, unspecified: Secondary | ICD-10-CM | POA: Diagnosis not present

## 2016-07-08 DIAGNOSIS — R1310 Dysphagia, unspecified: Secondary | ICD-10-CM | POA: Diagnosis not present

## 2016-07-08 DIAGNOSIS — R1319 Other dysphagia: Secondary | ICD-10-CM

## 2016-07-08 NOTE — Patient Instructions (Addendum)
GI pathogen.  Imodium twice a day as needed. Esophagram.

## 2016-07-08 NOTE — Progress Notes (Signed)
   Subjective:    Patient ID: Austin Mcdonald, male    DOB: 24-Apr-1960, 56 y.o.   MRN: [id number]  HPI Here today with c/o incontinence. Last seen in February of this year for f/u.  Caregiver states patient has been having loose stools and has been incontinent at times. Sometimes if he eats meats, it is slow to go down. Caregiver states his stools are usually like water. She has not tried anything for the diarrhea.  Has not been on any recent antibiotic. He is having 1-2 BMs a day. He is incontinent about 1-2 times a week. Symptoms for about a month. No new medications.  Appetite is good. No weight loss.  No abdominal pain. No melena or BRRB.    Underwent an EGD in December of 2016 for dysphagia and a screening colonoscopy.    02/23/2015 EGD ED &Colonoscopy  Indications: Patient is 56 year old African-American male with history of esophageal depression and diabetes mellitus who presents with 2 year history of intermittent solid food dysphagia and also gives history of weight loss. It is unclear weight loss is due to dysphagia or other reasons. He is also undergoing average risk screening colonoscopy. EGD findings: No evidence of esophageal stricture or ring formation. Gastritis and body and antrum with multiple erosions and a small antral ulcer. Multiple biopsies taken from mucosa at gastric body for routine histology. Esophagus dilated by passing 56 French Maloney dilator but no disruption noted to esophageal mucosa.  Biopsy: Chronic gastritis with H. pylori and intestinal metaplasia. No dysplasia or malignancy,.  Colonoscopy:   Small cecal polyp ablated via cold biopsy. Redundant colon with small diverticulum at hepatic flexure and another one at sigmoid colon. Normal rectal mucosa. Prominent hemorrhoids noted above the dentate line along with smaller hemorrhoids  below the dentate line. Biopsy: Tubular adenoma; No high grade dysplasia or malignancy  Review of Systems     Objective:   Physical Exam  Vitals:   07/08/16 1534  Weight: 190 lb 12.8 oz (86.5 kg)  Height: 6' (1.829 m)   Alert and oriented. Skin warm and dry. Oral mucosa is moist.   . Sclera anicteric, conjunctivae is pink. Thyroid not enlarged. No cervical lymphadenopathy. Lungs clear. Heart regular rate and rhythm.  Abdomen is soft. Bowel sounds are positive. No hepatomegaly. No abdominal masses felt. No tenderness.  No edema to lower extremities.          Assessment & Plan:  Diarrhea. Incontience. GI pathogen. Imodium once in am and one in pm.  Dysphagia: Will get a DG esophagram.

## 2016-07-11 LAB — GASTROINTESTINAL PATHOGEN PANEL PCR
C. difficile Tox A/B, PCR: NOT DETECTED
CAMPYLOBACTER, PCR: NOT DETECTED
CRYPTOSPORIDIUM, PCR: NOT DETECTED
E coli (ETEC) LT/ST PCR: NOT DETECTED
E coli (STEC) stx1/stx2, PCR: NOT DETECTED
E coli 0157, PCR: NOT DETECTED
GIARDIA LAMBLIA, PCR: NOT DETECTED
NOROVIRUS, PCR: NOT DETECTED
Rotavirus A, PCR: NOT DETECTED
SHIGELLA, PCR: NOT DETECTED
Salmonella, PCR: NOT DETECTED

## 2016-07-17 ENCOUNTER — Ambulatory Visit (HOSPITAL_COMMUNITY)
Admission: RE | Admit: 2016-07-17 | Discharge: 2016-07-17 | Disposition: A | Discharge status: Home / Self Care | Payer: Medicare Other | Source: Ambulatory Visit | Attending: Internal Medicine | Admitting: Internal Medicine

## 2016-07-17 DIAGNOSIS — K224 Dyskinesia of esophagus: Secondary | ICD-10-CM | POA: Diagnosis not present

## 2016-07-17 DIAGNOSIS — R1310 Dysphagia, unspecified: Secondary | ICD-10-CM

## 2016-07-17 DIAGNOSIS — R1319 Other dysphagia: Secondary | ICD-10-CM

## 2017-04-03 ENCOUNTER — Encounter (INDEPENDENT_AMBULATORY_CARE_PROVIDER_SITE_OTHER): Payer: Self-pay | Admitting: Internal Medicine

## 2017-05-09 ENCOUNTER — Ambulatory Visit (INDEPENDENT_AMBULATORY_CARE_PROVIDER_SITE_OTHER): Payer: Medicare Other | Admitting: Internal Medicine

## 2017-05-09 ENCOUNTER — Encounter (INDEPENDENT_AMBULATORY_CARE_PROVIDER_SITE_OTHER): Payer: Self-pay | Admitting: Internal Medicine

## 2017-05-09 VITALS — BP 138/68 | HR 60 | Temp 97.6°F | Ht 74.0 in | Wt 199.2 lb

## 2017-05-09 DIAGNOSIS — R1319 Other dysphagia: Secondary | ICD-10-CM

## 2017-05-09 DIAGNOSIS — R1310 Dysphagia, unspecified: Secondary | ICD-10-CM | POA: Insufficient documentation

## 2017-05-09 DIAGNOSIS — R197 Diarrhea, unspecified: Secondary | ICD-10-CM | POA: Diagnosis not present

## 2017-05-09 NOTE — Patient Instructions (Signed)
Imodium BID as needed. Make sure u chew your foods well.

## 2017-05-09 NOTE — Progress Notes (Signed)
   Subjective:    Patient ID: Austin Mcdonald, male    DOB: January 09, 1961, 57 y.o.   MRN: [id number] Resident of [facility] HPI Here today for f/u. Last seen in April of 2018 with diarrhea. GI pathogen was negative. He was advised to take Imodium BID His stools were better after starting the Imodium.  Also underwent an Esophagram which revealed IMPRESSION: Laryngeal penetration without aspiration.Minimal piriform sinus residuals. Diffuse esophageal dysmotility. When he drinks sodas, he has diarrhea. He is having a BM every time he buys a sodas.  If he doesn't drink sodas, his BMs are normal. Caregiver verifies this. His appetite is good. He has gained 9 pounds since his OV in April.  He states he is not having dysphagia.    In December of 2016 underwent and EGD/Colonoscopy (dysphagia, wt loss) EGD:   Impression:  EGD findings: No evidence of esophageal stricture or ring formation. Gastritis and body and antrum with multiple erosions and a small antral ulcer. Multiple biopsies taken from mucosa at gastric body for routine histology. Esophagus dilated by passing 56 French Maloney dilator but no disruption noted to esophageal mucosa. H. Pylori positive on EGD  Treated H. pylori infection with Pylera for 10 days. Patient is on pantoprazole.   Colonoscopy findings: Examination performed to cecum. Redundant colon. Small cecal polyp ablated via cold biopsy. Two small diverticula noted as above. Prominent internal hemorrhoids with small external hemorrhoids   Review of Systems     Objective:   Physical Exam Blood pressure 138/68, pulse 60, temperature 97.6 F (36.4 C), height 6\' 2"  (1.88 m), weight 199 lb 3.2 oz (90.4 kg). Alert and oriented. Skin warm and dry. Oral mucosa is moist.   . Sclera anicteric, conjunctivae is pink. Thyroid not enlarged. No cervical lymphadenopathy. Lungs clear. Heart regular rate and rhythm.  Abdomen is soft. Bowel sounds are positive. No  hepatomegaly. No abdominal masses felt. No tenderness.  No edema to lower extremities.           Assessment & Plan:  Diarrhea. Patient needs to stop drinking sodas. May taking BID as needed.  Dysphagia: Chew foods well. OV in 1 year.

## 2017-07-21 NOTE — Progress Notes (Signed)
Psychiatric Initial Adult Assessment   Patient Identification: Austin Mcdonald MRN:  [id number] Date of Evaluation:  07/22/2017 Referral Source: [facility] Chief Complaint:   Chief Complaint    Psychiatric Evaluation     Visit Diagnosis:    ICD-10-CM   1. Schizophrenia, unspecified type ([place]) F20.9     History of Present Illness:   Austin Mcdonald is a 57 y.o. year old male with a history of schizophrenia, tardive dyskinesia, hyperlipidemia, diabetes, who is referred for schizophrenia.   Reviewed note from [facility]. Note includes [id number]. Diagnosis includes schizophrenia in partial remission, GAD. Psychiatry office note; not legible.   The majority of the story is provided by Ms. Everlene Farrier, who is a care giver at home.  He states that he has been feeling good. When he is asked his typical day, he states that he makes up his room. He feels irritated by his roommate who gets on his nerves. He states that this person continues talking as he does not have education (Per Ms. Everlene Farrier, they get along well). He feels anxious at times when his family does not visit him as often. He does not recall the last time he met them, although they visited him on Thanksgiving per Ms. Everlene Farrier. 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 Everlene Farrier) and had worked a variety of job previously.   He feels depressed at times. He has insomnia. He has good appetite. He reports that there is "always a way out" and giggles when he is asked SI, although he denies any SI afterwards. He feels anxious at times. When he is asked to elaborate it, he states that "I go out, discharged, then no problem." He denies AH, VH, paranoia. He used to drink two fifths of liquor, 20 years ago. He denies drug use.   Per Ms. Verlene Mayer,  Patient is at family care home for many years. He has been on current medication for more than ten years, and it works well for him. Although he occasionally gets irritable, he has not  had any incident more than ten years when he was committed to [facility] for some behavior (details unknown). He makes false Press photographer of not providing mails to him. 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 He talks to himself at times. He paces at night when he has insomnia.   Per PMP,  Clonazepam last filled on 06/09/2017  I have utilized the [place] Controlled Substances Reporting System (PMP AWARxE) to confirm adherence regarding the patient's medication. My review reveals appropriate prescription fills.   Associated Signs/Symptoms: Depression Symptoms:  depressed mood, (Hypo) Manic Symptoms:  denies decreased need for sleep, euphoria Anxiety Symptoms:  denies Psychotic Symptoms:  denies, but the staff reports he is talking to himself PTSD Symptoms: Negative  Past Psychiatric History:  Outpatient:  [facility] (tw[facility] per patient) Previous suicide attempt: denies  Past trials of medication: thorazine, haldol, olanzapine,  History of violence: committed for irritability more than 15 years ago   Previous Psychotropic Medications: Yes   Substance Abuse History in the last 12 months:  No.  Consequences of Substance Abuse: NA  Past Medical History:  Past Medical History:  Diagnosis Date  . Depression   . Diabetes ([place])   . GERD (gastroesophageal reflux disease) 05/09/2016  . Schizophrenic disorder [facility])     Past Surgical History:  Procedure Laterality Date  . APPENDECTOMY    . COLONOSCOPY  N/A 02/23/2015   Procedure: COLONOSCOPY;  Surgeon: Rogene Houston, MD;  [facility];  Service: Endoscopy;  Laterality: N/A;  . ESOPHAGEAL DILATION N/A 02/23/2015   Procedure: ESOPHAGEAL DILATION;  Surgeon: Rogene Houston, MD;  [facility];  Service: Endoscopy;  Laterality: N/A;  . ESOPHAGOGASTRODUODENOSCOPY N/A 02/23/2015   Procedure: ESOPHAGOGASTRODUODENOSCOPY (EGD);  Surgeon: Rogene Houston, MD;   [facility];  Service: Endoscopy;  Laterality: N/A;  2:00    Family Psychiatric History: unknown  Family History: No family history on file.  Social History:   Social History   Socioeconomic History  . Marital status: Single    Spouse name: Not on file  . Number of children: Not on file  . Years of education: Not on file  . Highest education level: Not on file  Occupational History  . Not on file  Social Needs  . Financial resource strain: Not on file  . Food insecurity:    Worry: Not on file    Inability: Not on file  . Transportation needs:    Medical: Not on file    Non-medical: Not on file  Tobacco Use  . Smoking status: Current Every Day Smoker    Packs/day: 0.50    Types: Cigarettes  . Smokeless tobacco: Never Used  . Tobacco comment: 10 cigarettes a day  Substance and Sexual Activity  . Alcohol use: No    Alcohol/week: 0.0 oz  . Drug use: No  . Sexual activity: Not on file  Lifestyle  . Physical activity:    Days per week: Not on file    Minutes per session: Not on file  . Stress: Not on file  Relationships  . Social connections:    Talks on phone: Not on file    Gets together: Not on file    Attends religious service: Not on file    Active member of club or organization: Not on file    Attends meetings of clubs or organizations: Not on file    Relationship status: Not on file  Other Topics Concern  . Not on file  Social History Narrative  . 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 acetaminophen (TYLENOL) 500 MG  tablet Take 500 mg by mouth every 6 (six) hours as needed.    Marland Kitchen aspirin 81 MG tablet Take 81 mg by mouth daily.    . benztropine (COGENTIN) 2 MG tablet Take 1 tablet (2 mg total) by mouth 2 (two) times daily. 180 tablet 0  . citalopram (CELEXA) 20 MG tablet Take 20 mg by mouth daily.    . clonazePAM (KLONOPIN) 2 MG tablet Take 1 tablet (2 mg total) by mouth at bedtime. 90 tablet 0  . donepezil (ARICEPT) 10 MG tablet Take 1 tablet (10 mg total) by mouth at bedtime. 90 tablet 0  . econazole nitrate 1 % cream Apply 1 application topically daily.     Marland Kitchen ipratropium (ATROVENT) 0.03 % nasal spray Place 2 sprays into both nostrils every 12 (twelve) hours.    Marland Kitchen loratadine (CLARITIN) 10 MG  tablet Take 10 mg by mouth daily.    . meloxicam (MOBIC) 15 MG tablet Take 15 mg by mouth daily.    . metFORMIN (GLUCOPHAGE) 500 MG tablet Take 500 mg by mouth 2 (two) times daily with a meal.     . paliperidone (INVEGA) 3 MG 24 hr tablet Take 1 tablet (3 mg total) by mouth daily. 90 tablet 0  . pantoprazole (PROTONIX) 40 MG tablet TAKE 1 TABLET IN THE MORNING BEFORE BREAKFAST 28 tablet 5  . pravastatin (PRAVACHOL) 10 MG tablet Take 10 mg by mouth daily.    . risperidone (RISPERDAL) 4 MG tablet Take 1 tablet (4 mg total) by mouth 2 (two) times daily. 180 tablet 0  . traZODone (DESYREL) 100 MG tablet Take 100 mg by mouth at bedtime.     No current facility-administered medications for this visit.     Neurologic: Headache: No Seizure: No Paresthesias:No  Musculoskeletal: Strength & Muscle Tone: within normal limits Gait & Station: normal Patient leans: N/A  Psychiatric Specialty Exam: Review of Systems  Neurological: Positive for speech change.  Psychiatric/Behavioral: Positive for hallucinations and memory loss. Negative for depression, substance abuse and suicidal ideas. The patient is nervous/anxious and has insomnia.   All other systems reviewed and are negative.   Blood pressure 119/64, pulse (!) 53,  height _0  (1.88 m), weight 199 lb (90.3 kg), SpO2 99 %.Body mass index is 25.55 kg/m.  General Appearance: Fairly Groomed  Eye Contact:  Minimal  Speech:  Garbled  Volume:  Normal  Mood:  Anxious  Affect:  Non-Congruent  Thought Process:  Disorganized, tangential  Orientation:  Full (Time, Place, and Person)  Thought Content:  Delusions  Suicidal Thoughts:  No  Homicidal Thoughts:  No  Memory:  Immediate;   Fair  Judgement:  Fair  Insight:  Lacking  Psychomotor Activity:  Normal  Concentration:  Concentration: Good and Attention Span: Good  Recall:  Good  Fund of Knowledge:Good  Language: Good  Akathisia:  No  Handed:  Right  AIMS (if indicated):  Occasional resting tremors. No cogwheel rigidity  Assets:  Communication Skills Desire for Improvement  ADL's:  Intact  Cognition: Impaired,  Mild  Sleep:  poor   Assessment Austin Mcdonald is a 57 y.o. year old male with a history of schizophrenia, tardive dyskinesia, hyperlipidemia, diabetes, who is referred for schizophrenia.   # Schizophrenia Exam is notable for dysarthria, disorganization, delusion, and have some incongruent affect. The staff denies significant behavioral issues except mild chronic irritability and responding to internal stimuli. Although it is preferable to be on one antipsychotics, will continue current regimen as he is mostly stable on current medication for many years per report. Will continue paliperidone and risperidone for schizophrenia.  Discussed metabolic side effect and EPS.  Will continue Cogentin for EPS.  Will continue citalopram for anxiety.  Will continue clonazepam for anxiety. Will consider ingrezza at the next visit for tardive dyskinesia.   # r/o mild neurocognitive impairment Will consider MOCA at the next visit. Will continue donepezil for cognitive impairment.   Plan 1. Continue Paliperidone 3 mg AM 2. Continue Risperidone 4 mg BID 3. Continue cogentin 2 mg BID 4. Continue citalopram  20 mg daily  5. Continue donepezil 10 mg daily 6. Continue clonazepam 2 mg qhs 7. Obtain record from [facility] (check prolactin level if indicated) 8. Return to clinic in three months for 30 mins - Consider checking TSH, vitamin B 12, folate if not  checked  The patient demonstrates the following risk factors for suicide: Chronic risk factors for suicide include: psychiatric disorder of schizophrenia. Acute risk factors for suicide include: unemployment. Protective factors for this patient include: positive social support. Considering these factors, the overall suicide risk at this point appears to be low. Patient is appropriate for outpatient follow up.   Treatment Plan Summary: Plan as above   [name], MD 4/30/201912:00 PM

## 2017-07-22 ENCOUNTER — Ambulatory Visit (INDEPENDENT_AMBULATORY_CARE_PROVIDER_SITE_OTHER): Payer: Medicare Other | Admitting: Psychiatry

## 2017-07-22 ENCOUNTER — Telehealth (HOSPITAL_COMMUNITY): Payer: Self-pay

## 2017-07-22 ENCOUNTER — Encounter (HOSPITAL_COMMUNITY): Payer: Self-pay | Admitting: Psychiatry

## 2017-07-22 ENCOUNTER — Encounter (INDEPENDENT_AMBULATORY_CARE_PROVIDER_SITE_OTHER): Payer: Self-pay

## 2017-07-22 ENCOUNTER — Other Ambulatory Visit (HOSPITAL_COMMUNITY): Payer: Self-pay | Admitting: Psychiatry

## 2017-07-22 VITALS — BP 119/64 | HR 53 | Ht 74.0 in | Wt 199.0 lb

## 2017-07-22 DIAGNOSIS — F419 Anxiety disorder, unspecified: Secondary | ICD-10-CM | POA: Diagnosis not present

## 2017-07-22 DIAGNOSIS — F17210 Nicotine dependence, cigarettes, uncomplicated: Secondary | ICD-10-CM

## 2017-07-22 DIAGNOSIS — G4700 Insomnia, unspecified: Secondary | ICD-10-CM | POA: Diagnosis not present

## 2017-07-22 DIAGNOSIS — R450 Nervousness: Secondary | ICD-10-CM

## 2017-07-22 DIAGNOSIS — R413 Other amnesia: Secondary | ICD-10-CM | POA: Diagnosis not present

## 2017-07-22 DIAGNOSIS — F209 Schizophrenia, unspecified: Secondary | ICD-10-CM | POA: Diagnosis not present

## 2017-07-22 MED ORDER — CLONAZEPAM 2 MG PO TABS: 2.0000 mg | ORAL_TABLET | Freq: Every day | ORAL | 0 refills | Status: DC

## 2017-07-22 MED ORDER — PALIPERIDONE ER 3 MG PO TB24: 3.0000 mg | ORAL_TABLET | Freq: Every day | ORAL | 0 refills | Status: DC

## 2017-07-22 MED ORDER — TRAZODONE HCL 100 MG PO TABS: 100.0000 mg | ORAL_TABLET | Freq: Every day | ORAL | 0 refills | Status: DC

## 2017-07-22 MED ORDER — DONEPEZIL HCL 10 MG PO TABS: 10.0000 mg | ORAL_TABLET | Freq: Every day | ORAL | 0 refills | Status: DC

## 2017-07-22 MED ORDER — BENZTROPINE MESYLATE 2 MG PO TABS: 2.0000 mg | ORAL_TABLET | Freq: Two times a day (BID) | ORAL | 0 refills | Status: DC

## 2017-07-22 MED ORDER — RISPERIDONE 4 MG PO TABS: 4.0000 mg | ORAL_TABLET | Freq: Two times a day (BID) | ORAL | 0 refills | Status: DC

## 2017-07-22 NOTE — Telephone Encounter (Signed)
Ms. Austin Mcdonald called and request a refill of Trazodone 100 mg for the patient. Patient was seen in the office today, but didn't receive a refill. Script can be sent to [facility].  [place]. Please advise

## 2017-07-22 NOTE — Patient Instructions (Signed)
1. Continue Paliperidone 3 mg AM 2. Continue Risperidone 4 mg BID 3. Continue cogentin 2 mg BID 4. Continue citalopram 20 mg daily  5. Continue donepezil 10 mg daily 6. Continue clonazepam 2 mg qhs 7. Obtain record from [facility] 8. Return to clinic in three months for 30 mins

## 2017-07-22 NOTE — Telephone Encounter (Signed)
ordered

## 2017-07-23 NOTE — Telephone Encounter (Signed)
Called and spoke with Everlene Farrier to inform her that the patient's prescription has been called into the pharmacy

## 2017-10-14 ENCOUNTER — Other Ambulatory Visit (HOSPITAL_COMMUNITY): Payer: Self-pay | Admitting: Psychiatry

## 2017-10-14 MED ORDER — PALIPERIDONE ER 3 MG PO TB24: 3.0000 mg | ORAL_TABLET | Freq: Every day | ORAL | 0 refills | Status: DC

## 2017-10-14 MED ORDER — TRAZODONE HCL 100 MG PO TABS: 100.0000 mg | ORAL_TABLET | Freq: Every day | ORAL | 0 refills | Status: DC

## 2017-10-14 MED ORDER — RISPERIDONE 4 MG PO TABS: 4.0000 mg | ORAL_TABLET | Freq: Two times a day (BID) | ORAL | 0 refills | Status: DC

## 2017-10-14 MED ORDER — BENZTROPINE MESYLATE 2 MG PO TABS: 2.0000 mg | ORAL_TABLET | Freq: Two times a day (BID) | ORAL | 0 refills | Status: DC

## 2017-10-14 MED ORDER — CLONAZEPAM 2 MG PO TABS: 2.0000 mg | ORAL_TABLET | Freq: Every day | ORAL | 0 refills | Status: DC

## 2017-10-14 MED ORDER — DONEPEZIL HCL 10 MG PO TABS: 10.0000 mg | ORAL_TABLET | Freq: Every day | ORAL | 0 refills | Status: DC

## 2017-10-15 NOTE — Progress Notes (Signed)
BH MD/PA/NP OP Progress Note  10/21/2017 3:15 PM Austin Mcdonald  MRN:  [id number]  Chief Complaint:  Chief Complaint    Follow-up; Schizophrenia     HPI:  Patient presents for follow-up appointment for schizophrenia.  He is relatively a poor historian due to tangentiality and his dysarthria.  He states that "bipolar..lack of depression in my head" when he is asked how he is doing. He goes outside to smoke cigarette. His hobby is "cone collection." He denies AH, VH. He sleeps well. He has good appetite.   Ms. Verlene Mayer states that he has been doing okay. He comes out from his room. No concern for irritability or safety. He takes medication regularly.    Wt Readings from Last 3 Encounters:  10/21/17 202 lb (91.6 kg)  07/22/17 199 lb (90.3 kg)  05/09/17 199 lb 3.2 oz (90.4 kg)    Per PMP,  Clonazepam last filled on 10/15/2017    Visit Diagnosis:    ICD-10-CM   1. Schizophrenia, unspecified type ([place]) F20.9     Past Psychiatric History: Please see initial evaluation for full details. I have reviewed the history. No updates at this time.     Past Medical History:  Past Medical History:  Diagnosis Date  . Depression   . Diabetes ([place])   . GERD (gastroesophageal reflux disease) 05/09/2016  . Schizophrenic disorder [facility])     Past Surgical History:  Procedure Laterality Date  . APPENDECTOMY    . COLONOSCOPY N/A 02/23/2015   Procedure: COLONOSCOPY;  Surgeon: Rogene Houston, MD;  [facility];  Service: Endoscopy;  Laterality: N/A;  . ESOPHAGEAL DILATION N/A 02/23/2015   Procedure: ESOPHAGEAL DILATION;  Surgeon: Rogene Houston, MD;  [facility];  Service: Endoscopy;  Laterality: N/A;  . ESOPHAGOGASTRODUODENOSCOPY N/A 02/23/2015   Procedure: ESOPHAGOGASTRODUODENOSCOPY (EGD);  Surgeon: Rogene Houston, MD;  [facility];  Service: Endoscopy;  Laterality: N/A;  2:00    Family Psychiatric History: Please see initial evaluation for full  details. I have reviewed the history. No updates at this time.     Family History: No family history on file.  Social History:  Social History   Socioeconomic History  . Marital status: Single    Spouse name: Not on file  . Number of children: Not on file  . Years of education: Not on file  . Highest education level: Not on file  Occupational History  . Not on file  Social Needs  . Financial resource strain: Not on file  . Food insecurity:    Worry: Not on file    Inability: Not on file  . Transportation needs:    Medical: Not on file    Non-medical: Not on file  Tobacco Use  . Smoking status: Current Every Day Smoker    Packs/day: 0.50    Types: Cigarettes  . Smokeless tobacco: Never Used  . Tobacco comment: 10 cigarettes a day  Substance and Sexual Activity  . Alcohol use: No    Alcohol/week: 0.0 oz  . Drug use: No  . Sexual activity: Not on file  Lifestyle  . Physical activity:    Days per week: Not on file    Minutes per session: Not on file  . Stress: Not on file  Relationships  . Social connections:    Talks on phone: Not on file    Gets together: Not on file    Attends religious service: Not on file  Active member of club or organization: Not on file    Attends meetings of clubs or organizations: Not on file    Relationship status: Not on file  Other Topics Concern  . Not on file  Social History Narrative  . Not on file    Allergies: No Known Allergies  Metabolic Disorder Labs: No results found for: HGBA1C, MPG No results found for: PROLACTIN No results found for: CHOL, TRIG, HDL, CHOLHDL, VLDL, LDLCALC No results found for: TSH  Therapeutic Level Labs: No results found for: LITHIUM No results found for: VALPROATE No components found for:  CBMZ  Current Medications: Current Outpatient Medications  Medication Sig Dispense Refill  . acetaminophen (TYLENOL) 500 MG tablet Take 500 mg by mouth every 6 (six) hours as needed.    Marland Kitchen aspirin 81  MG tablet Take 81 mg by mouth daily.    . benztropine (COGENTIN) 1 MG tablet Take 1 tablet (1 mg total) by mouth 2 (two) times daily. 180 tablet 0  . citalopram (CELEXA) 20 MG tablet Take 20 mg by mouth daily.    Derrill Memo ON 11/14/2017] clonazePAM (KLONOPIN) 2 MG tablet Take 1 tablet (2 mg total) by mouth at bedtime. 30 tablet 1  . donepezil (ARICEPT) 10 MG tablet Take 1 tablet (10 mg total) by mouth at bedtime. 90 tablet 0  . econazole nitrate 1 % cream Apply 1 application topically daily.     Marland Kitchen ipratropium (ATROVENT) 0.03 % nasal spray Place 2 sprays into both nostrils every 12 (twelve) hours.    Marland Kitchen loratadine (CLARITIN) 10 MG tablet Take 10 mg by mouth daily.    . meloxicam (MOBIC) 15 MG tablet Take 15 mg by mouth daily.    . metFORMIN (GLUCOPHAGE) 500 MG tablet Take 500 mg by mouth 2 (two) times daily with a meal.     . paliperidone (INVEGA) 3 MG 24 hr tablet Take 1 tablet (3 mg total) by mouth daily. 90 tablet 0  . pantoprazole (PROTONIX) 40 MG tablet TAKE 1 TABLET IN THE MORNING BEFORE BREAKFAST 28 tablet 5  . pravastatin (PRAVACHOL) 10 MG tablet Take 10 mg by mouth daily.    . risperidone (RISPERDAL) 4 MG tablet Take 1 tablet (4 mg total) by mouth 2 (two) times daily. 180 tablet 0  . traZODone (DESYREL) 100 MG tablet Take 1 tablet (100 mg total) by mouth at bedtime. 90 tablet 0  . Paliperidone 1.5 MG TB24 Take 1 tablet (1.5 mg total) by mouth daily. 90 tablet 0   No current facility-administered medications for this visit.      Musculoskeletal: Strength & Muscle Tone: within normal limits Gait & Station: normal Patient leans: N/A  Psychiatric Specialty Exam: Review of Systems  Psychiatric/Behavioral: Positive for memory loss. Negative for depression, hallucinations, substance abuse and suicidal ideas. The patient is not nervous/anxious and does not have insomnia.   All other systems reviewed and are negative.   Blood pressure 135/74, pulse 62, height 6\' 2"  (1.88 m), weight 202 lb  (91.6 kg), SpO2 99 %.Body mass index is 25.94 kg/m.  General Appearance: Fairly Groomed  Eye Contact:  Good  Speech:  Clear and Coherent  Volume:  Normal  Mood:  "not answer"  Affect:  Constricted  Thought Process:  Coherent  Orientation:  Full (Time, Place, and Person) except date  Thought Content: Illogical   Suicidal Thoughts:  No  Homicidal Thoughts:  No  Memory:  Immediate;   Fair  Judgement:  Poor  Insight:  Lacking  Psychomotor Activity:  Normal  Concentration:  Concentration: Good and Attention Span: Good  Recall:  Poor  Fund of Knowledge: Good  Language: Good  Akathisia:  No  Handed:  Right  AIMS (if indicated): no resting tremors, no rigidity  Assets:  Communication Skills Desire for Improvement  ADL's:  Intact  Cognition: WNL  Sleep:  Good   Screenings: MOCA 21/30 10/21/17 -2 for visuospatial, -3 for subtraction/attention (However, he was able to name the months backswords), -5 for delayed recall, -1 for orientation (29 th)  Assessment and Plan:  JARMAN LITTON is a 57 y.o. year old male with a history of schizophrenia, tardive dyskinesia,hyperlipidemia, diabetes , who presents for follow up appointment for Schizophrenia, unspecified type ([place])  # Schizophrenia Exam is notable for dysarthria, disorganization and incongruent affect, which is consistent with the initial evaluation.  The staff denies any significant behavior issues since the last visit.  Will taper down paliperidone (for schizophrenia) to avoid polypharmacy.  The staff is notified to contact the clinic if any worsening in his symptoms.  Will continue Risperdal to target schizophrenia.  Discussed metabolic side effect and EPS.  Will taper down Cogentin for EPS. Will continue citalopram for anxiety. Will continue clonazepam for anxiety. Will consider Ingrezza in the future if any obvious signs of tardive dyskinesia.   # r/o mild neurocognitive disorder He does have cognitive deficits as characterized  by Moca.  Will continue donepezil for cognitive impairment.   Plan 1. Decrease Paliperidone 1.5  mg AM 2. Continue Risperidone 4 mg twice a day 3. Decrease cogentin 1 mg twice a day  4. Continue citalopram 20 mg daily  5. Continue donepezil 10 mg daily 6. Continue clonazepam 2 mg qhs 7. Return to clinic in three months for 15 mins 8. Check TSH, Vitamin B 12, folate Reviewed labs- Hb1c 5.9%, Lipids wnl,  09/2017,    The patient demonstrates the following risk factors for suicide: Chronic risk factors for suicide include: psychiatric disorder of schizophrenia. Acute risk factors for suicide include: unemployment. Protective factors for this patient include: positive social support. Considering these factors, the overall suicide risk at this point appears to be low. Patient is appropriate for outpatient follow up.  The duration of this appointment visit was 30 minutes of face-to-face time with the patient.  Greater than 50% of this time was spent in counseling, explanation of  diagnosis, planning of further management, and coordination of care.  [name], MD 10/21/2017, 3:15 PM

## 2017-10-21 ENCOUNTER — Ambulatory Visit (INDEPENDENT_AMBULATORY_CARE_PROVIDER_SITE_OTHER): Payer: Medicare Other | Admitting: Psychiatry

## 2017-10-21 ENCOUNTER — Telehealth (HOSPITAL_COMMUNITY): Payer: Self-pay | Admitting: Psychiatry

## 2017-10-21 ENCOUNTER — Encounter (HOSPITAL_COMMUNITY): Payer: Self-pay | Admitting: Psychiatry

## 2017-10-21 VITALS — BP 135/74 | HR 62 | Ht 74.0 in | Wt 202.0 lb

## 2017-10-21 DIAGNOSIS — Z79899 Other long term (current) drug therapy: Secondary | ICD-10-CM | POA: Diagnosis not present

## 2017-10-21 DIAGNOSIS — F209 Schizophrenia, unspecified: Secondary | ICD-10-CM

## 2017-10-21 MED ORDER — PALIPERIDONE ER 1.5 MG PO TB24: 1.5000 mg | ORAL_TABLET | Freq: Every day | ORAL | 0 refills | Status: DC

## 2017-10-21 MED ORDER — BENZTROPINE MESYLATE 1 MG PO TABS: 1.0000 mg | ORAL_TABLET | Freq: Two times a day (BID) | ORAL | 0 refills | Status: DC

## 2017-10-21 MED ORDER — CLONAZEPAM 2 MG PO TABS: 2.0000 mg | ORAL_TABLET | Freq: Every day | ORAL | 1 refills | Status: DC

## 2017-10-21 NOTE — Telephone Encounter (Signed)
Obtained record from Dr. Durene Fruits, dated back to 07/2015. He has beenon paliperidone 3 mg, risperidone 4 mg BID since that time. Noclear documentation of rationale of using two antipsychotics.

## 2017-10-21 NOTE — Patient Instructions (Signed)
1. Decrease Paliperidone 1.5  mg AM 2. Continue Risperidone 4 mg twice a day 3. Decrease cogentin 1 mg twice a day  4. Continue citalopram 20 mg daily  5. Continue donepezil 10 mg daily 6. Continue clonazepam 2 mg qhs 7. Return to clinic in three months for 15 mins 8. Check TSH, Vitamin B 12, folate

## 2017-12-08 ENCOUNTER — Other Ambulatory Visit (HOSPITAL_COMMUNITY): Payer: Self-pay | Admitting: Psychiatry

## 2017-12-08 MED ORDER — CLONAZEPAM 2 MG PO TABS: 2.0000 mg | ORAL_TABLET | Freq: Every day | ORAL | 0 refills | Status: DC

## 2017-12-09 LAB — TSH: TSH: 0.52 mIU/L (ref 0.40–4.50)

## 2017-12-09 LAB — B12 AND FOLATE PANEL
Folate: 10.5 ng/mL
Vitamin B-12: 368 pg/mL (ref 200–1100)

## 2018-01-06 ENCOUNTER — Other Ambulatory Visit (HOSPITAL_COMMUNITY): Payer: Self-pay | Admitting: Psychiatry

## 2018-01-06 MED ORDER — BENZTROPINE MESYLATE 1 MG PO TABS: 1.0000 mg | ORAL_TABLET | Freq: Two times a day (BID) | ORAL | 0 refills | Status: DC

## 2018-01-06 MED ORDER — RISPERIDONE 4 MG PO TABS: 4.0000 mg | ORAL_TABLET | Freq: Two times a day (BID) | ORAL | 0 refills | Status: DC

## 2018-01-06 MED ORDER — TRAZODONE HCL 100 MG PO TABS: 100.0000 mg | ORAL_TABLET | Freq: Every day | ORAL | 0 refills | Status: DC

## 2018-01-06 MED ORDER — PALIPERIDONE ER 3 MG PO TB24: 3.0000 mg | ORAL_TABLET | Freq: Every day | ORAL | 0 refills | Status: DC

## 2018-01-07 ENCOUNTER — Other Ambulatory Visit (HOSPITAL_COMMUNITY): Payer: Self-pay | Admitting: Psychiatry

## 2018-01-07 ENCOUNTER — Telehealth (HOSPITAL_COMMUNITY): Payer: Self-pay | Admitting: Psychiatry

## 2018-01-07 MED ORDER — PALIPERIDONE ER 1.5 MG PO TB24: 1.5000 mg | ORAL_TABLET | Freq: Every day | ORAL | 0 refills | Status: DC

## 2018-01-07 MED ORDER — DONEPEZIL HCL 10 MG PO TABS: 10.0000 mg | ORAL_TABLET | Freq: Every day | ORAL | 0 refills | Status: DC

## 2018-01-07 NOTE — Telephone Encounter (Signed)
Great. I ordered Invega 1.5 mg for 90 days.

## 2018-01-07 NOTE — Telephone Encounter (Signed)
Spoke with the family [facility] center & they hold off on the 3 mg & wait on 1.5 mg script

## 2018-01-07 NOTE — Telephone Encounter (Signed)
Could you contact the facility. It seems like Invega 3 mg was ordered while I was on leave by the other provider- it was supposed to be 1.5 mg. Could you ask them if they will be willing to fill lower dose (1.5 mg), which he has been taking it since the last visit.

## 2018-01-09 ENCOUNTER — Other Ambulatory Visit (HOSPITAL_COMMUNITY): Payer: Self-pay | Admitting: Psychiatry

## 2018-01-09 ENCOUNTER — Telehealth (HOSPITAL_COMMUNITY): Payer: Self-pay | Admitting: *Deleted

## 2018-01-09 MED ORDER — CITALOPRAM HYDROBROMIDE 20 MG PO TABS: 20.0000 mg | ORAL_TABLET | Freq: Every day | ORAL | 0 refills | Status: DC

## 2018-01-09 NOTE — Telephone Encounter (Signed)
Dr Modesta Messing [facility] home mgr. called requesting refill on citalopram (CELEXA) 20 MG tablet

## 2018-01-09 NOTE — Telephone Encounter (Signed)
ordered

## 2018-01-21 NOTE — Progress Notes (Signed)
Deaf Smith MD/PA/NP OP Progress Note  01/22/2018 2:28 PM Austin Mcdonald  MRN:  [id number]  Chief Complaint:  Chief Complaint    Schizophrenia; Psychiatric Evaluation     HPI:  Patient presents late for follow-up appointment for schizophrenia.   He states that he has been doing well.  He complains of some sensation in his leg.  He feels that there is a tapeworm on his left leg.  He denies any other tactile hallucinations or AH, VH.  He feels safe at home.  He feels "normal." He cleans up garbage, which is a good exercise for him.  He sleeps well.  He denies feeling depressed or anxious.  He has good appetite.  He denies SI, HI.   Ms. Verlene Mayer states that he has been doing well.  There has been no change since the last visit.  There is no safety concern or behavior issues. His provider reportedly attributes his leg complains to arthritis.    Wt Readings from Last 3 Encounters:  01/22/18 204 lb (92.5 kg)  10/21/17 202 lb (91.6 kg)  07/22/17 199 lb (90.3 kg)    Visit Diagnosis:    ICD-10-CM   1. Schizophrenia, unspecified type ([place]) F20.9     Past Psychiatric History: Please see initial evaluation for full details. I have reviewed the history. No updates at this time.     Past Medical History:  Past Medical History:  Diagnosis Date  . Depression   . Diabetes ([place])   . GERD (gastroesophageal reflux disease) 05/09/2016  . Schizophrenic disorder [facility])     Past Surgical History:  Procedure Laterality Date  . APPENDECTOMY    . COLONOSCOPY N/A 02/23/2015   Procedure: COLONOSCOPY;  Surgeon: Rogene Houston, MD;  [facility];  Service: Endoscopy;  Laterality: N/A;  . ESOPHAGEAL DILATION N/A 02/23/2015   Procedure: ESOPHAGEAL DILATION;  Surgeon: Rogene Houston, MD;  [facility];  Service: Endoscopy;  Laterality: N/A;  . ESOPHAGOGASTRODUODENOSCOPY N/A 02/23/2015   Procedure: ESOPHAGOGASTRODUODENOSCOPY (EGD);  Surgeon: Rogene Houston, MD;  [facility];  Service: Endoscopy;  Laterality: N/A;  2:00    Family Psychiatric History: Please see initial evaluation for full details. I have reviewed the history. No updates at this time.     Family History: No family history on file.  Social History:  Social History   Socioeconomic History  . Marital status: Single    Spouse name: Not on file  . Number of children: Not on file  . Years of education: Not on file  . Highest education level: Not on file  Occupational History  . Not on file  Social Needs  . Financial resource strain: Not on file  . Food insecurity:    Worry: Not on file    Inability: Not on file  . Transportation needs:    Medical: Not on file    Non-medical: Not on file  Tobacco Use  . Smoking status: Current Every Day Smoker    Packs/day: 0.50    Types: Cigarettes  . Smokeless tobacco: Never Used  . Tobacco comment: 10 cigarettes a day  Substance and Sexual Activity  . Alcohol use: No    Alcohol/week: 0.0 standard drinks  . Drug use: No  . Sexual activity: Not on file  Lifestyle  . Physical activity:    Days per week: Not on file    Minutes per session: Not on file  . Stress: Not on file  Relationships  .  Social connections:    Talks on phone: Not on file    Gets together: Not on file    Attends religious service: Not on file    Active member of club or organization: Not on file    Attends meetings of clubs or organizations: Not on file    Relationship status: Not on file  Other Topics Concern  . Not on file  Social History Narrative  . Not on file    Allergies: No Known Allergies  Metabolic Disorder Labs: No results found for: HGBA1C, MPG No results found for: PROLACTIN No results found for: CHOL, TRIG, HDL, CHOLHDL, VLDL, LDLCALC Lab Results  Component Value Date   TSH 0.52 12/08/2017    Therapeutic Level Labs: No results found for: LITHIUM No results found for: VALPROATE No components found for:  CBMZ  Current  Medications: Current Outpatient Medications  Medication Sig Dispense Refill  . acetaminophen (TYLENOL) 500 MG tablet Take 500 mg by mouth every 6 (six) hours as needed.    Marland Kitchen aspirin 81 MG tablet Take 81 mg by mouth daily.    . benztropine (COGENTIN) 1 MG tablet Take 1 tablet (1 mg total) by mouth 2 (two) times daily. 180 tablet 0  . citalopram (CELEXA) 20 MG tablet Take 1 tablet (20 mg total) by mouth daily. 90 tablet 0  . [START ON 02/06/2018] clonazePAM (KLONOPIN) 2 MG tablet Take 1 tablet (2 mg total) by mouth at bedtime. 30 tablet 2  . donepezil (ARICEPT) 10 MG tablet Take 1 tablet (10 mg total) by mouth at bedtime. 90 tablet 1  . econazole nitrate 1 % cream Apply 1 application topically daily.     Marland Kitchen ipratropium (ATROVENT) 0.03 % nasal spray Place 2 sprays into both nostrils every 12 (twelve) hours.    Marland Kitchen loratadine (CLARITIN) 10 MG tablet Take 10 mg by mouth daily.    . meloxicam (MOBIC) 15 MG tablet Take 15 mg by mouth daily.    . metFORMIN (GLUCOPHAGE) 500 MG tablet Take 500 mg by mouth 2 (two) times daily with a meal.     . Paliperidone 1.5 MG TB24 Take 1 tablet (1.5 mg total) by mouth daily. 90 tablet 0  . pantoprazole (PROTONIX) 40 MG tablet TAKE 1 TABLET IN THE MORNING BEFORE BREAKFAST 28 tablet 5  . pravastatin (PRAVACHOL) 10 MG tablet Take 10 mg by mouth daily.    . risperidone (RISPERDAL) 4 MG tablet Take 1 tablet (4 mg total) by mouth 2 (two) times daily. 180 tablet 0  . traZODone (DESYREL) 100 MG tablet Take 1 tablet (100 mg total) by mouth at bedtime. 90 tablet 0   No current facility-administered medications for this visit.      Musculoskeletal: Strength & Muscle Tone: within normal limits Gait & Station: normal Patient leans: N/A  Psychiatric Specialty Exam: Review of Systems  Psychiatric/Behavioral: Negative for depression, hallucinations, memory loss, substance abuse and suicidal ideas. The patient is not nervous/anxious and does not have insomnia.   All other  systems reviewed and are negative.   Blood pressure 131/79, pulse 67, height 6\' 2"  (1.88 m), weight 204 lb (92.5 kg), SpO2 99 %.Body mass index is 26.19 kg/m.  General Appearance: Fairly Groomed  Eye Contact:  Good  Speech:  dysarthria  Volume:  Normal  Mood:  "fine  Affect:  Appropriate, Congruent and calm restricted  Thought Process:  Coherent  Orientation:  Full (Time, Place, and Person)  Thought Content: Logical   Suicidal Thoughts:  No  Homicidal Thoughts:  No  Memory:  Immediate;   Good  Judgement:  Fair  Insight:  Present  Psychomotor Activity:  Normal  Concentration:  Concentration: Good and Attention Span: Good  Recall:  Good  Fund of Knowledge: Good  Language: Good  Akathisia:  No  Handed:  Right  AIMS (if indicated): no rigidity, no tardive dyskinesia, no tremors  Assets:  Social Support  ADL's:  Intact  Cognition: WNL  Sleep:  Good   Screenings: MOCA 21/30 10/21/17 -2 for visuospatial, -3 for subtraction/attention (However, he was able to name the months backswords), -5 for delayed recall, -1 for orientation (29 th)  Assessment and Plan:  QUENTON RECENDEZ is a 57 y.o. year old male with a history of schizophrenia, , tardive dyskinesia,hyperlipidemia, diabetes  , who presents for follow up appointment for Schizophrenia, unspecified type ([place])  # Schizophrenia Although exam is notable for dysarthria, there is some improvement in disorganization since the last visit.  The staff denies any significant behavioral issues or change since tapering down paliperidone.  Although it is preferable to be on one antipsychotics, will stay on the current regimen at this time given patient complains of tactile sensation on his leg (His provider reportedly attributes it to arthritis).  Will consider father tapering down of paliperidone if there is no change at the next visit.  Will continue paliperidone and Risperdal to target schizophrenia.  Discussed metabolic side effect and EPS.   Will continue Cogentin for EPS at this time; will consider tapering this off in the future.  Will continue clonazepam for anxiety.   # r/o mild neurocognitive disorder He does have a cognitive deficits as characterized by [place].  Will continue donepezil for cognitive impairment.   Plan 1. Continue Paliperidone 1.5  mg AM 2. Continue Risperidone 4 mg twice a day 3. Continue cogentin 1 mg twice a day  4. Continue citalopram 20 mg daily  5. Continue donepezil 10 mg daily 6. Continue clonazepam 2 mg qhs 7. Return to clinic in three months for 15 mins 8. Check TSH, Vitamin B 12, folate - reviewed, wnl Reviewed labs- Hb1c 5.9%, Lipids wnl,  09/2017,    The patient demonstrates the following risk factors for suicide: Chronic risk factors for suicide include:psychiatric disorder ofschizophrenia. Acute risk factorsfor suicide include: unemployment. Protective factorsfor this patient include: positive social support. Considering these factors, the overall suicide risk at this point appears to below. Patientisappropriate for outpatient follow up.   [name], MD 01/22/2018, 2:28 PM

## 2018-01-22 ENCOUNTER — Ambulatory Visit (INDEPENDENT_AMBULATORY_CARE_PROVIDER_SITE_OTHER): Payer: Medicare Other | Admitting: Psychiatry

## 2018-01-22 VITALS — BP 131/79 | HR 67 | Ht 74.0 in | Wt 204.0 lb

## 2018-01-22 DIAGNOSIS — F209 Schizophrenia, unspecified: Secondary | ICD-10-CM | POA: Diagnosis not present

## 2018-01-22 MED ORDER — CITALOPRAM HYDROBROMIDE 20 MG PO TABS: 20.0000 mg | ORAL_TABLET | Freq: Every day | ORAL | 0 refills | Status: DC

## 2018-01-22 MED ORDER — BENZTROPINE MESYLATE 1 MG PO TABS: 1.0000 mg | ORAL_TABLET | Freq: Two times a day (BID) | ORAL | 0 refills | Status: DC

## 2018-01-22 MED ORDER — CLONAZEPAM 2 MG PO TABS: 2.0000 mg | ORAL_TABLET | Freq: Every day | ORAL | 2 refills | Status: DC

## 2018-01-22 MED ORDER — DONEPEZIL HCL 10 MG PO TABS: 10.0000 mg | ORAL_TABLET | Freq: Every day | ORAL | 1 refills | Status: DC

## 2018-01-22 NOTE — Patient Instructions (Addendum)
1. Continue Paliperidone 1.5  mg AM 2. Continue Risperidone 4 mg twice a day 3. Continuecogentin 1 mg twice a day  4. Continue citalopram 20 mg daily  5. Continue donepezil 10 mg daily 6. Continue clonazepam 2 mg qhs 7. Return to clinic in four months for 15 mins

## 2018-02-10 IMAGING — RF DG ESOPHAGUS
12 series · 14 of 24 positions shown · non-contrast
Comparison: None

CLINICAL DATA: Esophageal dysphagia, has a difficulty swallowing
solids at the level of the cervical region for a long time

EXAM:
ESOPHOGRAM / BARIUM SWALLOW / BARIUM TABLET STUDY
TECHNIQUE: Combined double contrast and single contrast examination performed
using effervescent crystals, thick barium liquid, and thin barium
liquid. The patient was observed with fluoroscopy swallowing a 13 mm
barium sulphate tablet.
FLUOROSCOPY TIME:  Fluoroscopy Time:  1 minutes 36 seconds
Radiation Exposure Index (if provided by the fluoroscopic device):
27.2 mGy
Number of Acquired Spot Images: multiple fluoroscopic screen
captures

[Series 1: cp_standard · 0.18mm/px · 1 of 82 frames shown (1 of 12)]
[frame 13/82]
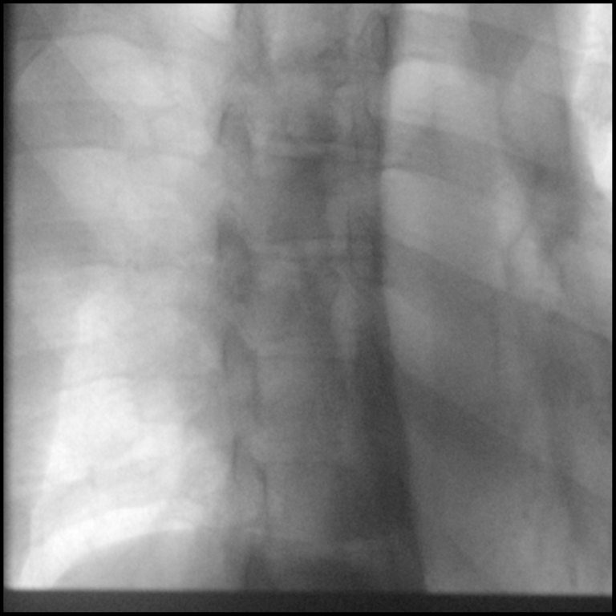

[Series 2: cp_standard · 0.18mm/px · 1 of 30 frames shown (2 of 12)]
[frame 5/30]
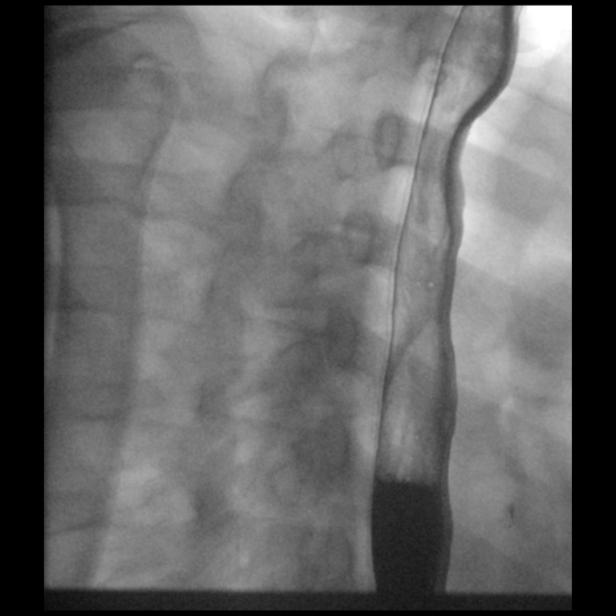

[Series 3: cp_standard · 0.18mm/px · 1 of 27 frames shown (3 of 12)]
[frame 5/27]
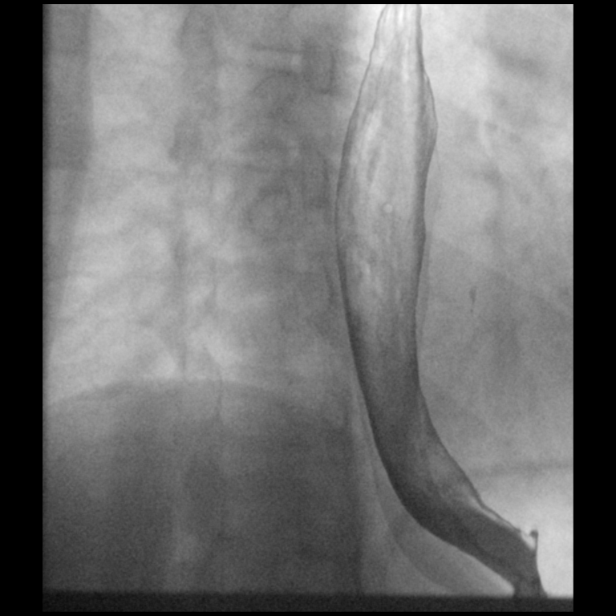

[Series 4: cp_standard · 0.18mm/px · 2 of 59 frames shown (4 of 12)]
[frame 30/59]
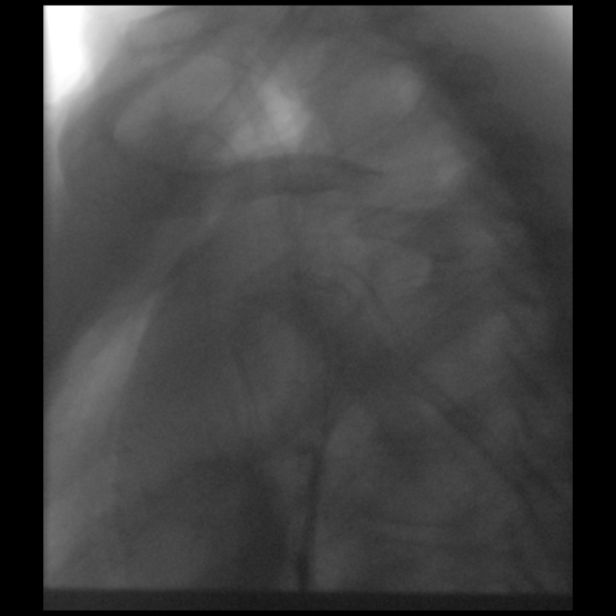
[frame 51/59]
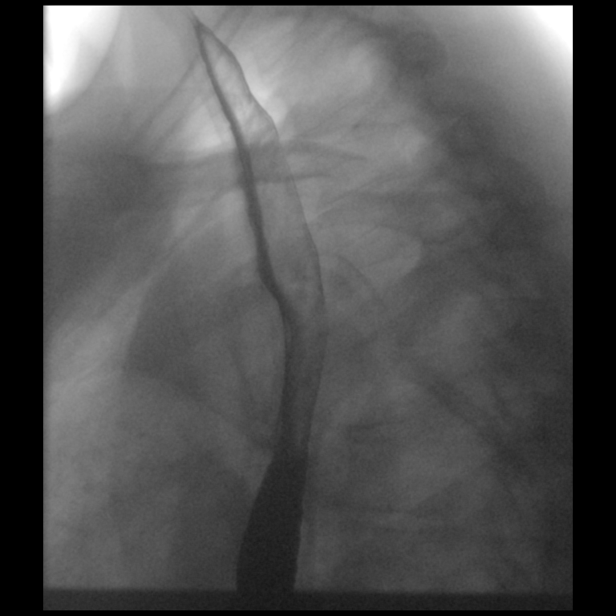

[Series 5: cp_standard · 0.18mm/px · 1 of 48 frames shown (5 of 12)]
[frame 41/48]
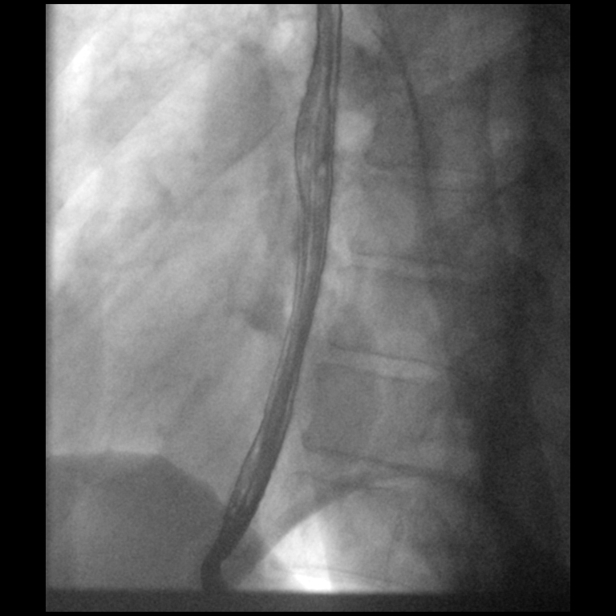

[Series 6: cp_standard · 0.18mm/px · 1 of 118 frames shown (6 of 12)]
[frame 101/118]
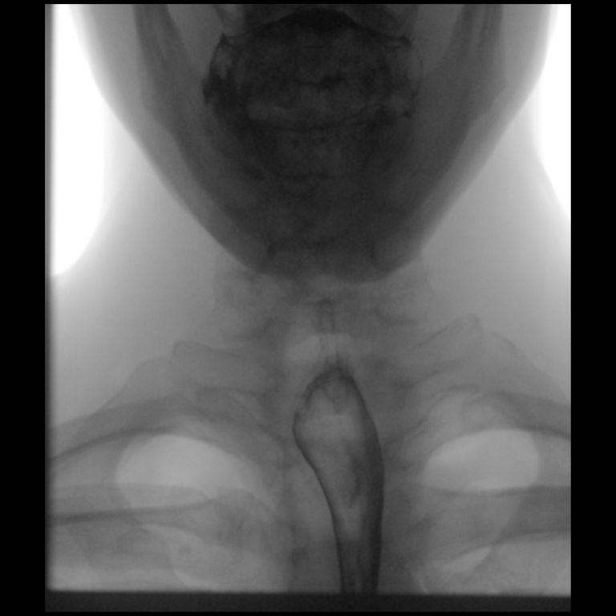

[Series 7: cp_standard · 0.18mm/px · 1 of 64 frames shown (7 of 12)]
[frame 13/64]
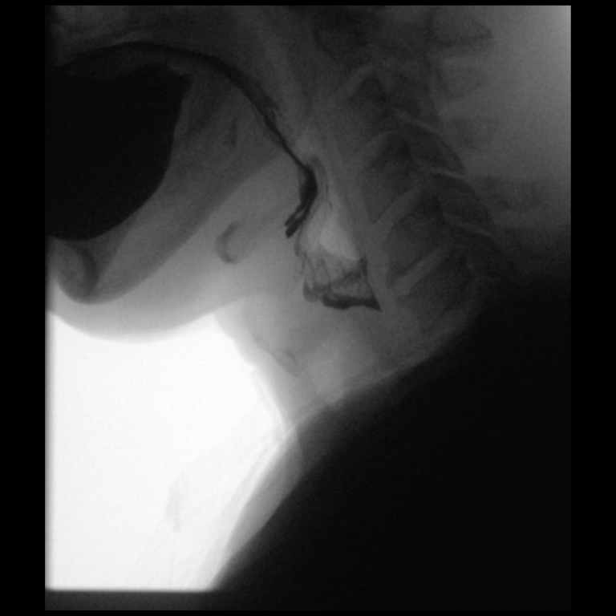

[Series 8: cp_standard · 0.19mm/px · 1 of 12 frames shown (8 of 12)]
[frame 2/12]
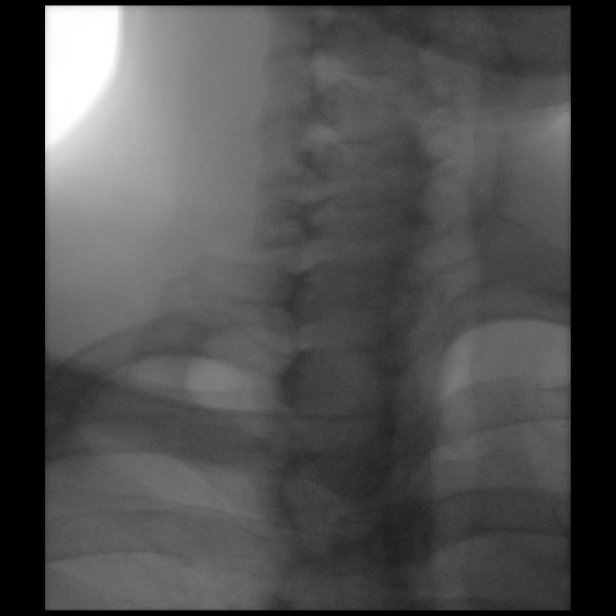

[Series 9: cp_standard · 0.19mm/px · 1 of 51 frames shown (9 of 12)]
[frame 26/51]
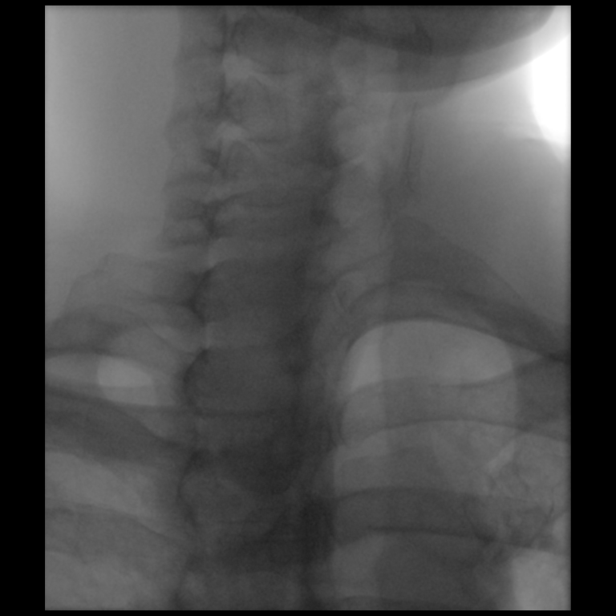

[Series 10: cp_standard · 0.19mm/px · 2 of 16 frames shown (10 of 12)]
[frame 3/16]
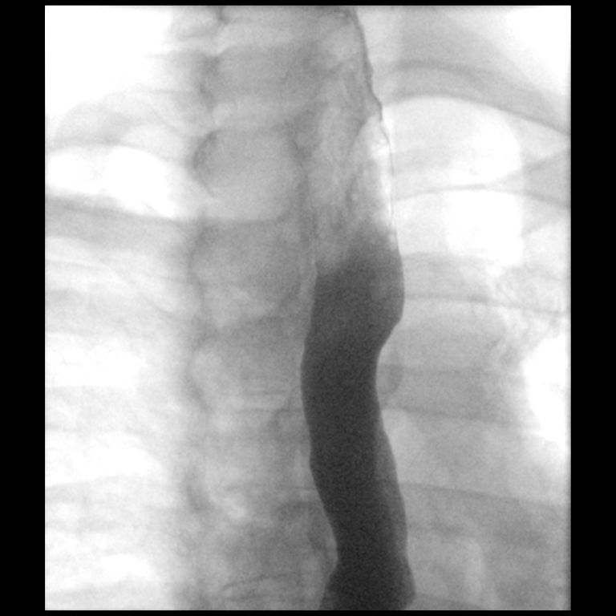
[frame 14/16]
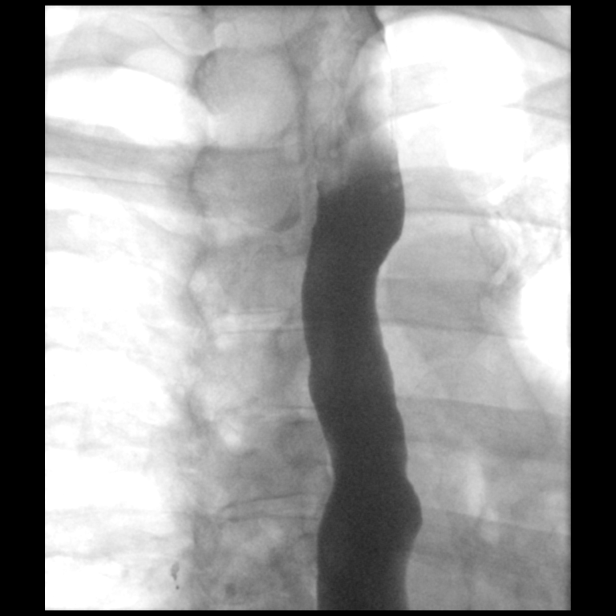

[Series 11: cp_standard · 0.19mm/px · 1 of 70 frames shown (11 of 12)]
[frame 60/70]
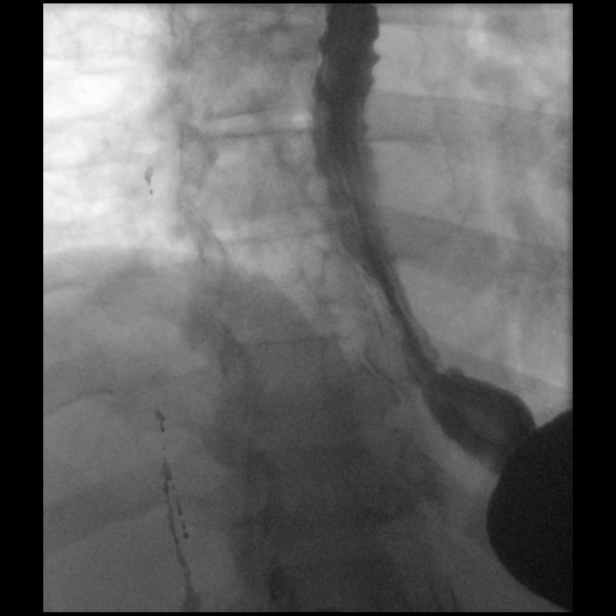

[Series 12: cp_standard · 0.19mm/px · 1 of 81 frames shown (12 of 12)]
[frame 69/81]
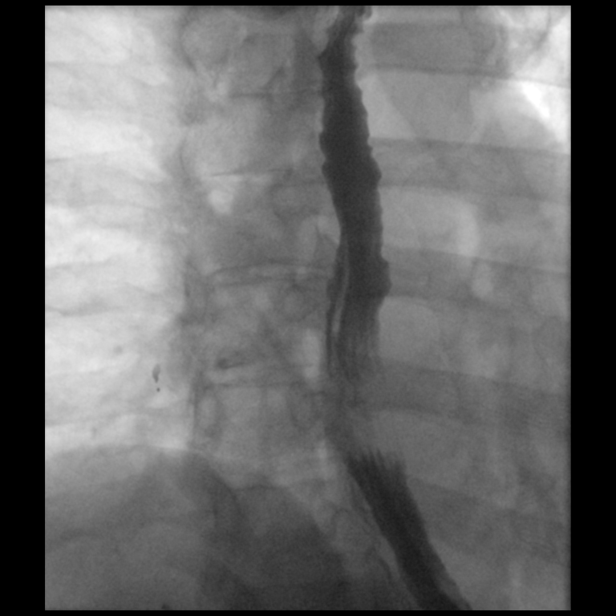

[14 of 24 positions shown; findings below may reference images not displayed]

FINDINGS: Esophageal distention: Normal distention without mass or stricture

Filling defects:  None

12.5 mm barium tablet: Passed from oral cavity to stomach without
obstruction

Motility: Diffuse impairment of esophageal motility with incomplete
clearance of barium by primary peristaltic waves. Numerous secondary
and tertiary waves seen.

Mucosa:  Smooth without irregularity or ulceration

Hypopharynx/cervical esophagus: Laryngeal penetration without
aspiration. Minimal piriform sinus residuals.

Hiatal hernia:  Absent

GE reflux:  Not identified during exam

Other:  N/A
IMPRESSION: Laryngeal penetration without aspiration.

Minimal piriform sinus residuals.

Diffuse esophageal dysmotility.

## 2018-03-31 ENCOUNTER — Other Ambulatory Visit (HOSPITAL_COMMUNITY): Payer: Self-pay | Admitting: Psychiatry

## 2018-03-31 MED ORDER — TRAZODONE HCL 100 MG PO TABS: 100.0000 mg | ORAL_TABLET | Freq: Every day | ORAL | 0 refills | Status: DC

## 2018-03-31 MED ORDER — RISPERIDONE 4 MG PO TABS: 4.0000 mg | ORAL_TABLET | Freq: Two times a day (BID) | ORAL | 0 refills | Status: DC

## 2018-03-31 MED ORDER — CLONAZEPAM 2 MG PO TABS: 2.0000 mg | ORAL_TABLET | Freq: Every day | ORAL | 2 refills | Status: DC

## 2018-03-31 MED ORDER — PALIPERIDONE ER 1.5 MG PO TB24: 1.5000 mg | ORAL_TABLET | Freq: Every day | ORAL | 0 refills | Status: DC

## 2018-05-11 ENCOUNTER — Ambulatory Visit (INDEPENDENT_AMBULATORY_CARE_PROVIDER_SITE_OTHER): Payer: Self-pay | Admitting: Internal Medicine

## 2018-05-13 ENCOUNTER — Ambulatory Visit (INDEPENDENT_AMBULATORY_CARE_PROVIDER_SITE_OTHER): Payer: Medicare Other | Admitting: Internal Medicine

## 2018-05-13 ENCOUNTER — Encounter (INDEPENDENT_AMBULATORY_CARE_PROVIDER_SITE_OTHER): Payer: Self-pay | Admitting: Internal Medicine

## 2018-05-13 VITALS — BP 133/73 | HR 65 | Temp 97.8°F | Ht 73.0 in | Wt 200.3 lb

## 2018-05-13 DIAGNOSIS — K219 Gastro-esophageal reflux disease without esophagitis: Secondary | ICD-10-CM | POA: Diagnosis not present

## 2018-05-13 DIAGNOSIS — R1319 Other dysphagia: Secondary | ICD-10-CM

## 2018-05-13 DIAGNOSIS — R1310 Dysphagia, unspecified: Secondary | ICD-10-CM

## 2018-05-13 NOTE — Progress Notes (Signed)
Subjective:    Patient ID: Austin Mcdonald, male    DOB: 05-12-1960, 58 y.o.   MRN: [id number]  HPI Here today for f/u. Last seen in February of 2019. Resident of [facility]. Hx of dysphagia. He tells me he is doing alright. He tries to stay active.  He helps around the home. Sweeps and takes out the garbage. Appetite is good. Weight is stable. He denies any dysphagia. He has to take his time with meats. BMs are normal. No melena or BRRB.             In December of 2016 underwent and EGD/Colonoscopy (dysphagia, wt loss) EGD:   Impression:  EGD findings: No evidence of esophageal stricture or ring formation. Gastritis and body and antrum with multiple erosions and a small antral ulcer. Multiple biopsies taken from mucosa at gastric body for routine histology. Esophagus dilated by passing 56 French Maloney dilator but no disruption noted to esophageal mucosa. H. Pylori positive on EGD  Treated H. pylori infection with Pylera for 10 days. Patient is on pantoprazole.   Colonoscopy findings: Examination performed to cecum. Redundant colon. Small cecal polyp ablated via cold biopsy. Two small diverticula noted as above. Prominent internal hemorrhoids with small external hemorrhoids  Review of Systems Past Medical History:  Diagnosis Date  . Depression   . Diabetes ([place])   . GERD (gastroesophageal reflux disease) 05/09/2016  . Schizophrenic disorder [facility])     Past Surgical History:  Procedure Laterality Date  . APPENDECTOMY    . COLONOSCOPY N/A 02/23/2015   Procedure: COLONOSCOPY;  Surgeon: Rogene Houston, MD;  [facility];  Service: Endoscopy;  Laterality: N/A;  . ESOPHAGEAL DILATION N/A 02/23/2015   Procedure: ESOPHAGEAL DILATION;  Surgeon: Rogene Houston, MD;  [facility];  Service: Endoscopy;  Laterality: N/A;  . ESOPHAGOGASTRODUODENOSCOPY N/A 02/23/2015   Procedure: ESOPHAGOGASTRODUODENOSCOPY (EGD);  Surgeon: Rogene Houston, MD;   [facility];  Service: Endoscopy;  Laterality: N/A;  2:00    No Known Allergies  Current Outpatient Medications on File Prior to Visit  Medication Sig Dispense Refill  . aspirin 81 MG tablet Take 81 mg by mouth daily.    Marland Kitchen azelastine (ASTELIN) 0.1 % nasal spray Place 1 spray into both nostrils 2 (two) times daily. Use in each nostril as directed    . benztropine (COGENTIN) 1 MG tablet Take 1 tablet (1 mg total) by mouth 2 (two) times daily. 180 tablet 0  . citalopram (CELEXA) 20 MG tablet Take 1 tablet (20 mg total) by mouth daily. 90 tablet 0  . clonazePAM (KLONOPIN) 2 MG tablet Take 1 tablet (2 mg total) by mouth at bedtime. 30 tablet 2  . donepezil (ARICEPT) 10 MG tablet Take 1 tablet (10 mg total) by mouth at bedtime. 90 tablet 1  . fluticasone (FLONASE) 50 MCG/ACT nasal spray Place into both nostrils daily.    Marland Kitchen lisinopril (PRINIVIL,ZESTRIL) 10 MG tablet Take 10 mg by mouth daily.    Marland Kitchen loratadine (CLARITIN) 10 MG tablet Take 10 mg by mouth daily.    . meloxicam (MOBIC) 15 MG tablet Take 15 mg by mouth daily.    . metFORMIN (GLUCOPHAGE) 500 MG tablet Take 500 mg by mouth 2 (two) times daily with a meal.     . pantoprazole (PROTONIX) 40 MG tablet TAKE 1 TABLET IN THE MORNING BEFORE BREAKFAST 28 tablet 5  . pravastatin (PRAVACHOL) 10 MG tablet Take 10 mg by mouth  daily.    . risperidone (RISPERDAL) 4 MG tablet Take 1 tablet (4 mg total) by mouth 2 (two) times daily. 180 tablet 0  . traZODone (DESYREL) 100 MG tablet Take 1 tablet (100 mg total) by mouth at bedtime. 90 tablet 0   No current facility-administered medications on file prior to visit.         Objective:   Physical Exam Blood pressure 133/73, pulse 65, temperature 97.8 F (36.6 C), height 6\' 1"  (1.854 m), weight 200 lb 4.8 oz (90.9 kg). Alert and oriented. Skin warm and dry. Oral mucosa is moist.   . Sclera anicteric, conjunctivae is pink. Thyroid not enlarged. No cervical lymphadenopathy. Lungs clear. Heart  regular rate and rhythm.  Abdomen is soft. Bowel sounds are positive. No hepatomegaly. No abdominal masses felt. No tenderness.  No edema to lower extremities.          Assessment & Plan:  Dysphagia. He will chew his food well. He will continue the Protonix. OV in 1 year.  GERD. He will continue the Protonix. OV in 1 year.

## 2018-05-13 NOTE — Patient Instructions (Addendum)
Chew food well.  OV in 1 year.  Continue the Protonix

## 2018-05-18 NOTE — Progress Notes (Signed)
BH MD/PA/NP OP Progress Note  05/25/2018 2:00 PM Austin Mcdonald  MRN:  [id number]  Chief Complaint:  Chief Complaint    Follow-up; Schizophrenia     HPI:  Patient presents for follow-up appointment for schizophrenia.  He states that he feels "dragged."  He denies any concern or paranoia.  He denies feeling depressed or anxious.  He denies SI, HI, hallucinations. He complains of mild chest discomfort; the staff is advised to follow with PCP.  Ms. Everlene Farrier presents to the interview.  He has been doing very well.  He enjoys watching TV. She allows him to have him own space as he wants to work on things.  He sleeps well at night.  He has good appetite.  He has not had any behavior issues.  Ms. Everlene Farrier notices that he may be drooling more lately.   Clonazepam filled on 05/12/2018   Visit Diagnosis:    ICD-10-CM   1. Schizophrenia, unspecified type ([place]) F20.9     Past Psychiatric History: Please see initial evaluation for full details. I have reviewed the history. No updates at this time.     Past Medical History:  Past Medical History:  Diagnosis Date  . Depression   . Diabetes ([place])   . GERD (gastroesophageal reflux disease) 05/09/2016  . Schizophrenic disorder [facility])     Past Surgical History:  Procedure Laterality Date  . APPENDECTOMY    . COLONOSCOPY N/A 02/23/2015   Procedure: COLONOSCOPY;  Surgeon: Rogene Houston, MD;  [facility];  Service: Endoscopy;  Laterality: N/A;  . ESOPHAGEAL DILATION N/A 02/23/2015   Procedure: ESOPHAGEAL DILATION;  Surgeon: Rogene Houston, MD;  [facility];  Service: Endoscopy;  Laterality: N/A;  . ESOPHAGOGASTRODUODENOSCOPY N/A 02/23/2015   Procedure: ESOPHAGOGASTRODUODENOSCOPY (EGD);  Surgeon: Rogene Houston, MD;  [facility];  Service: Endoscopy;  Laterality: N/A;  2:00    Family Psychiatric History: Please see initial evaluation for full details. I have reviewed the history. No updates at this time.      Family History: No family history on file.  Social History:  Social History   Socioeconomic History  . Marital status: Single    Spouse name: Not on file  . Number of children: Not on file  . Years of education: Not on file  . Highest education level: Not on file  Occupational History  . Not on file  Social Needs  . Financial resource strain: Not on file  . Food insecurity:    Worry: Not on file    Inability: Not on file  . Transportation needs:    Medical: Not on file    Non-medical: Not on file  Tobacco Use  . Smoking status: Current Every Day Smoker    Packs/day: 0.50    Types: Cigarettes  . Smokeless tobacco: Never Used  . Tobacco comment: 10 cigarettes a day  Substance and Sexual Activity  . Alcohol use: No    Alcohol/week: 0.0 standard drinks  . Drug use: No  . Sexual activity: Not on file  Lifestyle  . Physical activity:    Days per week: Not on file    Minutes per session: Not on file  . Stress: Not on file  Relationships  . Social connections:    Talks on phone: Not on file    Gets together: Not on file    Attends religious service: Not on file    Active member of club or organization: Not on file  Attends meetings of clubs or organizations: Not on file    Relationship status: Not on file  Other Topics Concern  . Not on file  Social History Narrative  . Not on file    Allergies: No Known Allergies  Metabolic Disorder Labs: No results found for: HGBA1C, MPG No results found for: PROLACTIN No results found for: CHOL, TRIG, HDL, CHOLHDL, VLDL, LDLCALC Lab Results  Component Value Date   TSH 0.52 12/08/2017    Therapeutic Level Labs: No results found for: LITHIUM No results found for: VALPROATE No components found for:  CBMZ  Current Medications: Current Outpatient Medications  Medication Sig Dispense Refill  . aspirin 81 MG tablet Take 81 mg by mouth daily.    Marland Kitchen azelastine (ASTELIN) 0.1 % nasal spray Place 1 spray into both  nostrils 2 (two) times daily. Use in each nostril as directed    . benztropine (COGENTIN) 1 MG tablet Take 1 tablet (1 mg total) by mouth 2 (two) times daily. 180 tablet 0  . citalopram (CELEXA) 20 MG tablet Take 1 tablet (20 mg total) by mouth daily. 90 tablet 0  . [START ON 06/10/2018] clonazePAM (KLONOPIN) 2 MG tablet Take 1 tablet (2 mg total) by mouth at bedtime. 30 tablet 2  . [START ON 07/23/2018] donepezil (ARICEPT) 10 MG tablet Take 1 tablet (10 mg total) by mouth at bedtime. 90 tablet 0  . fluticasone (FLONASE) 50 MCG/ACT nasal spray Place into both nostrils daily.    Marland Kitchen lisinopril (PRINIVIL,ZESTRIL) 10 MG tablet Take 10 mg by mouth daily.    Marland Kitchen loratadine (CLARITIN) 10 MG tablet Take 10 mg by mouth daily.    . meloxicam (MOBIC) 15 MG tablet Take 15 mg by mouth daily.    . metFORMIN (GLUCOPHAGE) 500 MG tablet Take 500 mg by mouth 2 (two) times daily with a meal.     . pantoprazole (PROTONIX) 40 MG tablet TAKE 1 TABLET IN THE MORNING BEFORE BREAKFAST 28 tablet 5  . pravastatin (PRAVACHOL) 10 MG tablet Take 10 mg by mouth daily.    Derrill Memo ON 06/26/2018] risperidone (RISPERDAL) 4 MG tablet Take 1 tablet (4 mg total) by mouth 2 (two) times daily. 180 tablet 0  . [START ON 06/26/2018] traZODone (DESYREL) 100 MG tablet Take 1 tablet (100 mg total) by mouth at bedtime. 90 tablet 0   No current facility-administered medications for this visit.      Musculoskeletal: Strength & Muscle Tone: within normal limits Gait & Station: normal Patient leans: N/A  Psychiatric Specialty Exam: Review of Systems  Psychiatric/Behavioral: Negative for depression, hallucinations, memory loss, substance abuse and suicidal ideas. The patient is not nervous/anxious and does not have insomnia.   All other systems reviewed and are negative.   Blood pressure 100/68, pulse 62, height 6\' 1"  (1.854 m), weight 198 lb (89.8 kg), SpO2 98 %.Body mass index is 26.12 kg/m.  General Appearance: Fairly Groomed  Eye Contact:   Good  Speech:  Clear and Coherent  Volume:  Normal  Mood:  "dragged"  Affect:  Constricted  Thought Process:  Coherent  Orientation:  Full (Time, Place, and Person)  Thought Content: Logical   Suicidal Thoughts:  No  Homicidal Thoughts:  No  Memory:  Immediate;   Good  Judgement:  Fair  Insight:  Shallow  Psychomotor Activity:  Normal  Concentration:  Concentration: Good and Attention Span: Good  Recall:  Good  Fund of Knowledge: Good  Language: Good  Akathisia:  No  Handed:  Right  AIMS (if indicated): no rigidity, no tremors  Assets:  Communication Skills Desire for Improvement  ADL's:  Intact  Cognition: WNL  Sleep:  Good   Screenings:   Assessment and Plan:  Austin Mcdonald is a 58 y.o. year old male with a history of schizophrenia, tardive dyskinesia,hyperlipidemia, diabetes , who presents for follow up appointment for Schizophrenia, unspecified type [facility])   # Schizophrenia Although exam is notable for dysarthria, he demonstrates linear thought process.  The staff denies any significant behavioral issues since the last visit.  Will discontinue paliperidone to avoid polypharmacy.  Will continue Risperdal to target schizophrenia.  Discussed potential metabolic side effect and EPS.  Will continue Cogentin for EPS at this time; will plan to tapering it down in the future.  Will continue clonazepam for anxiety.   # r/o mild neurocognitive disorder He does have a cognitive deficits as characterized by Moca.  Will continue donepezil for cognitive impairment.   Plan 1. Discontinue Paliperidone 2. Continue Risperidone 4 mg twice a day 3. Continue cogentin 1 mg twice a day  4. Continue citalopram 20 mg daily 5. Continue donepezil 10 mg daily 6. Continue clonazepam 2 mg qhs 7.Return to clinic inthree months for 15 mins 8. Check TSH, Vitamin B 12, folate - reviewed, wnl Reviewedlabs- Hb1c 5.9%,Lipids wnl, 09/2017,    The patient demonstrates the following risk  factors for suicide: Chronic risk factors for suicide include:psychiatric disorder ofschizophrenia. Acute risk factorsfor suicide include: unemployment. Protective factorsfor this patient include: positive social support. Considering these factors, the overall suicide risk at this point appears to below. Patientisappropriate for outpatient follow up.  [name], MD 05/25/2018, 2:00 PM

## 2018-05-25 ENCOUNTER — Ambulatory Visit (INDEPENDENT_AMBULATORY_CARE_PROVIDER_SITE_OTHER): Payer: Medicare Other | Admitting: Psychiatry

## 2018-05-25 ENCOUNTER — Encounter (HOSPITAL_COMMUNITY): Payer: Self-pay | Admitting: Psychiatry

## 2018-05-25 VITALS — BP 100/68 | HR 62 | Ht 73.0 in | Wt 198.0 lb

## 2018-05-25 DIAGNOSIS — F209 Schizophrenia, unspecified: Secondary | ICD-10-CM | POA: Diagnosis not present

## 2018-05-25 MED ORDER — RISPERIDONE 4 MG PO TABS: 4.0000 mg | ORAL_TABLET | Freq: Two times a day (BID) | ORAL | 0 refills | Status: DC

## 2018-05-25 MED ORDER — BENZTROPINE MESYLATE 1 MG PO TABS: 1.0000 mg | ORAL_TABLET | Freq: Two times a day (BID) | ORAL | 0 refills | Status: DC

## 2018-05-25 MED ORDER — TRAZODONE HCL 100 MG PO TABS: 100.0000 mg | ORAL_TABLET | Freq: Every day | ORAL | 0 refills | Status: DC

## 2018-05-25 MED ORDER — CLONAZEPAM 2 MG PO TABS: 2.0000 mg | ORAL_TABLET | Freq: Every day | ORAL | 2 refills | Status: DC

## 2018-05-25 MED ORDER — DONEPEZIL HCL 10 MG PO TABS: 10.0000 mg | ORAL_TABLET | Freq: Every day | ORAL | 0 refills | Status: DC

## 2018-05-25 MED ORDER — CITALOPRAM HYDROBROMIDE 20 MG PO TABS: 20.0000 mg | ORAL_TABLET | Freq: Every day | ORAL | 0 refills | Status: DC

## 2018-05-25 NOTE — Patient Instructions (Signed)
1. Discontinue Paliperidone 2. Continue Risperidone 4 mg twice a day 3. Continue cogentin 1 mg twice a day  4. Continue citalopram 20 mg daily 5. Continue donepezil 10 mg daily 6. Continue clonazepam 2 mg qhs 7.Return to clinic inthree months for 15 mins

## 2018-05-26 ENCOUNTER — Other Ambulatory Visit (HOSPITAL_COMMUNITY): Payer: Self-pay | Admitting: Psychiatry

## 2018-07-15 ENCOUNTER — Other Ambulatory Visit (HOSPITAL_COMMUNITY): Payer: Self-pay | Admitting: Psychiatry

## 2018-07-15 MED ORDER — TRAZODONE HCL 100 MG PO TABS: 100.0000 mg | ORAL_TABLET | Freq: Every day | ORAL | 0 refills | Status: DC

## 2018-07-15 MED ORDER — RISPERIDONE 4 MG PO TABS: 4.0000 mg | ORAL_TABLET | Freq: Two times a day (BID) | ORAL | 0 refills | Status: DC

## 2018-07-15 MED ORDER — DONEPEZIL HCL 10 MG PO TABS: 10.0000 mg | ORAL_TABLET | Freq: Every day | ORAL | 0 refills | Status: DC

## 2018-07-15 MED ORDER — CITALOPRAM HYDROBROMIDE 20 MG PO TABS: 20.0000 mg | ORAL_TABLET | Freq: Every day | ORAL | 0 refills | Status: DC

## 2018-07-15 MED ORDER — BENZTROPINE MESYLATE 1 MG PO TABS: 1.0000 mg | ORAL_TABLET | Freq: Two times a day (BID) | ORAL | 0 refills | Status: DC

## 2018-08-19 NOTE — Progress Notes (Deleted)
BH MD/PA/NP OP Progress Note  08/19/2018 1:05 PM Austin Mcdonald  MRN:  [id number]  Chief Complaint:  HPI: *** Visit Diagnosis: No diagnosis found.  Past Psychiatric History: Please see initial evaluation for full details. I have reviewed the history. No updates at this time.     Past Medical History:  Past Medical History:  Diagnosis Date  . Depression   . Diabetes ([place])   . GERD (gastroesophageal reflux disease) 05/09/2016  . Schizophrenic disorder [facility])     Past Surgical History:  Procedure Laterality Date  . APPENDECTOMY    . COLONOSCOPY N/A 02/23/2015   Procedure: COLONOSCOPY;  Surgeon: Rogene Houston, MD;  [facility];  Service: Endoscopy;  Laterality: N/A;  . ESOPHAGEAL DILATION N/A 02/23/2015   Procedure: ESOPHAGEAL DILATION;  Surgeon: Rogene Houston, MD;  [facility];  Service: Endoscopy;  Laterality: N/A;  . ESOPHAGOGASTRODUODENOSCOPY N/A 02/23/2015   Procedure: ESOPHAGOGASTRODUODENOSCOPY (EGD);  Surgeon: Rogene Houston, MD;  [facility];  Service: Endoscopy;  Laterality: N/A;  2:00    Family Psychiatric History: Please see initial evaluation for full details. I have reviewed the history. No updates at this time.     Family History: No family history on file.  Social History:  Social History   Socioeconomic History  . Marital status: Single    Spouse name: Not on file  . Number of children: Not on file  . Years of education: Not on file  . Highest education level: Not on file  Occupational History  . Not on file  Social Needs  . Financial resource strain: Not on file  . Food insecurity:    Worry: Not on file    Inability: Not on file  . Transportation needs:    Medical: Not on file    Non-medical: Not on file  Tobacco Use  . Smoking status: Current Every Day Smoker    Packs/day: 0.50    Types: Cigarettes  . Smokeless tobacco: Never Used  . Tobacco comment: 10 cigarettes a day  Substance and Sexual Activity  .  Alcohol use: No    Alcohol/week: 0.0 standard drinks  . Drug use: No  . Sexual activity: Not on file  Lifestyle  . Physical activity:    Days per week: Not on file    Minutes per session: Not on file  . Stress: Not on file  Relationships  . Social connections:    Talks on phone: Not on file    Gets together: Not on file    Attends religious service: Not on file    Active member of club or organization: Not on file    Attends meetings of clubs or organizations: Not on file    Relationship status: Not on file  Other Topics Concern  . Not on file  Social History Narrative  . Not on file    Allergies: No Known Allergies  Metabolic Disorder Labs: No results found for: HGBA1C, MPG No results found for: PROLACTIN No results found for: CHOL, TRIG, HDL, CHOLHDL, VLDL, LDLCALC Lab Results  Component Value Date   TSH 0.52 12/08/2017    Therapeutic Level Labs: No results found for: LITHIUM No results found for: VALPROATE No components found for:  CBMZ  Current Medications: Current Outpatient Medications  Medication Sig Dispense Refill  . aspirin 81 MG tablet Take 81 mg by mouth daily.    Marland Kitchen azelastine (ASTELIN) 0.1 % nasal spray Place 1 spray into both nostrils 2 (  two) times daily. Use in each nostril as directed    . [START ON 08/24/2018] benztropine (COGENTIN) 1 MG tablet Take 1 tablet (1 mg total) by mouth 2 (two) times daily. 60 tablet 0  . [START ON 08/24/2018] citalopram (CELEXA) 20 MG tablet Take 1 tablet (20 mg total) by mouth daily. 30 tablet 0  . clonazePAM (KLONOPIN) 2 MG tablet Take 1 tablet (2 mg total) by mouth at bedtime. 30 tablet 2  . [START ON 08/24/2018] donepezil (ARICEPT) 10 MG tablet Take 1 tablet (10 mg total) by mouth at bedtime. 30 tablet 0  . fluticasone (FLONASE) 50 MCG/ACT nasal spray Place into both nostrils daily.    Marland Kitchen lisinopril (PRINIVIL,ZESTRIL) 10 MG tablet Take 10 mg by mouth daily.    Marland Kitchen loratadine (CLARITIN) 10 MG tablet Take 10 mg by mouth daily.     . meloxicam (MOBIC) 15 MG tablet Take 15 mg by mouth daily.    . metFORMIN (GLUCOPHAGE) 500 MG tablet Take 500 mg by mouth 2 (two) times daily with a meal.     . pantoprazole (PROTONIX) 40 MG tablet TAKE 1 TABLET IN THE MORNING BEFORE BREAKFAST 28 tablet 5  . pravastatin (PRAVACHOL) 10 MG tablet Take 10 mg by mouth daily.    Derrill Memo ON 08/24/2018] risperidone (RISPERDAL) 4 MG tablet Take 1 tablet (4 mg total) by mouth 2 (two) times daily. 60 tablet 0  . [START ON 08/24/2018] traZODone (DESYREL) 100 MG tablet Take 1 tablet (100 mg total) by mouth at bedtime. 30 tablet 0   No current facility-administered medications for this visit.      Musculoskeletal: Strength & Muscle Tone: N/A Gait & Station: N/A Patient leans: N/A  Psychiatric Specialty Exam: ROS  There were no vitals taken for this visit.There is no height or weight on file to calculate BMI.  General Appearance: {Appearance:22683}  Eye Contact:  {B[facility] EYE CONTACT:22684}  Speech:  Clear and Coherent  Volume:  Normal  Mood:  {B[facility] MOOD:22306}  Affect:  {Affect (PAA):22687}  Thought Process:  Coherent  Orientation:  Full (Time, Place, and Person)  Thought Content: Logical   Suicidal Thoughts:  {ST/HT (PAA):22692}  Homicidal Thoughts:  {ST/HT (PAA):22692}  Memory:  Immediate;   Good  Judgement:  {Judgement (PAA):22694}  Insight:  {Insight (PAA):22695}  Psychomotor Activity:  Normal  Concentration:  Concentration: Good and Attention Span: Good  Recall:  Good  Fund of Knowledge: Good  Language: Good  Akathisia:  No  Handed:  Right  AIMS (if indicated): not done  Assets:  Communication Skills Desire for Improvement  ADL's:  Intact  Cognition: WNL  Sleep:  {B[facility] GOOD/FAIR/POOR:22877}   Screenings:   Assessment and Plan:  Austin Mcdonald is a 58 y.o. year old male with a history of schizophrenia,  tardive dyskinesia,hyperlipidemia, diabetes , who presents for follow up appointment for No diagnosis found.  #  Schizophrenia  Although exam is notable for dysarthria, he demonstrates linear thought process.  The staff denies any significant behavioral issues since the last visit.  Will discontinue paliperidone to avoid polypharmacy.  Will continue Risperdal to target schizophrenia.  Discussed potential metabolic side effect and EPS.  Will continue Cogentin for EPS at this time; will plan to tapering it down in the future.  Will continue clonazepam for anxiety.   # r/o mild neurocognitive disorder He does have a cognitive deficits as characterized by Moca.  Will continue donepezil for cognitive impairment.   Plan 1. Discontinue Paliperidone 2. Continue Risperidone  4 mg twice a day 3.Continuecogentin 1 mg twice a day  4. Continue citalopram 20 mg daily 5. Continue donepezil 10 mg daily 6. Continue clonazepam 2 mg qhs 7.Return to clinic inthree months for 15 mins 8. Check TSH, Vitamin B 12, folate- reviewed, wnl Reviewedlabs- Hb1c 5.9%,Lipids wnl, 09/2017,    The patient demonstrates the following risk factors for suicide: Chronic risk factors for suicide include:psychiatric disorder ofschizophrenia. Acute risk factorsfor suicide include: unemployment. Protective factorsfor this patient include: positive social support. Considering these factors, the overall suicide risk at this point appears to below. Patientisappropriate for outpatient follow up.  [name], MD 08/19/2018, 1:05 PM

## 2018-08-27 ENCOUNTER — Other Ambulatory Visit: Payer: Self-pay

## 2018-08-27 ENCOUNTER — Ambulatory Visit (HOSPITAL_COMMUNITY): Payer: Medicare Other | Admitting: Psychiatry

## 2018-08-27 NOTE — Progress Notes (Signed)
Virtual Visit via Telephone Note  I connected with Austin Mcdonald on 09/02/18 at 11:20 AM EDT by telephone and verified that I am speaking with the correct person using two identifiers.   I discussed the limitations, risks, security and privacy concerns of performing an evaluation and management service by telephone and the availability of in person appointments. I also discussed with the patient that there may be a patient responsible charge related to this service. The patient expressed understanding and agreed to proceed.     I discussed the assessment and treatment plan with the patient. The patient was provided an opportunity to ask questions and all were answered. The patient agreed with the plan and demonstrated an understanding of the instructions.   The patient was advised to call back or seek an in-person evaluation if the symptoms worsen or if the condition fails to improve as anticipated.  I provided 15 minutes of non-face-to-face time during this encounter.   [name], MD    [facility] MD/PA/NP OP Progress Note  09/02/2018 11:35 AM Austin Mcdonald  MRN:  [id number]  Chief Complaint:  Chief Complaint    Schizophrenia; Follow-up     HPI:  This is a follow-up visit for schizophrenia.  He states that he cleaned the room this morning when he was asked about today's activity. When he is asked if he watches TV (he usually enjoys it,) he states that TV does not bother him, but he watches it. He denies insomnia. He denies feeling depressed, anxious. He denies SI, HI, AH, VH. He denies paranoia. He feels safe at his place.   Ms. Everlene Farrier presents to the phone interview.  He has been doing very well.  There has been no change since he is off Paliperidone. He takes a walk in the yard. He is unable to go outside due to pandemic. He sleeps and eats well. No safety concern. He takes medication regularly. Both Mr. Theresia Majors and Ms. Everlene Farrier notices that he has drooling. He also has sinus  infection.    Visit Diagnosis:    ICD-10-CM   1. Schizophrenia, unspecified type ([place]) F20.9     Past Psychiatric History: Please see initial evaluation for full details. I have reviewed the history. No updates at this time.     Past Medical History:  Past Medical History:  Diagnosis Date  . Depression   . Diabetes ([place])   . GERD (gastroesophageal reflux disease) 05/09/2016  . Schizophrenic disorder [facility])     Past Surgical History:  Procedure Laterality Date  . APPENDECTOMY    . COLONOSCOPY N/A 02/23/2015   Procedure: COLONOSCOPY;  Surgeon: Rogene Houston, MD;  [facility];  Service: Endoscopy;  Laterality: N/A;  . ESOPHAGEAL DILATION N/A 02/23/2015   Procedure: ESOPHAGEAL DILATION;  Surgeon: Rogene Houston, MD;  [facility];  Service: Endoscopy;  Laterality: N/A;  . ESOPHAGOGASTRODUODENOSCOPY N/A 02/23/2015   Procedure: ESOPHAGOGASTRODUODENOSCOPY (EGD);  Surgeon: Rogene Houston, MD;  [facility];  Service: Endoscopy;  Laterality: N/A;  2:00    Family Psychiatric History: Please see initial evaluation for full details. I have reviewed the history. No updates at this time.     Family History: No family history on file.  Social History:  Social History   Socioeconomic History  . Marital status: Single    Spouse name: Not on file  . Number of children: Not on file  . Years of education: Not on file  . Highest education  level: Not on file  Occupational History  . Not on file  Social Needs  . Financial resource strain: Not on file  . Food insecurity:    Worry: Not on file    Inability: Not on file  . Transportation needs:    Medical: Not on file    Non-medical: Not on file  Tobacco Use  . Smoking status: Current Every Day Smoker    Packs/day: 0.50    Types: Cigarettes  . Smokeless tobacco: Never Used  . Tobacco comment: 10 cigarettes a day  Substance and Sexual Activity  . Alcohol use: No    Alcohol/week: 0.0 standard  drinks  . Drug use: No  . Sexual activity: Not on file  Lifestyle  . Physical activity:    Days per week: Not on file    Minutes per session: Not on file  . Stress: Not on file  Relationships  . Social connections:    Talks on phone: Not on file    Gets together: Not on file    Attends religious service: Not on file    Active member of club or organization: Not on file    Attends meetings of clubs or organizations: Not on file    Relationship status: Not on file  Other Topics Concern  . Not on file  Social History Narrative  . Not on file    Allergies: No Known Allergies  Metabolic Disorder Labs: No results found for: HGBA1C, MPG No results found for: PROLACTIN No results found for: CHOL, TRIG, HDL, CHOLHDL, VLDL, LDLCALC Lab Results  Component Value Date   TSH 0.52 12/08/2017    Therapeutic Level Labs: No results found for: LITHIUM No results found for: VALPROATE No components found for:  CBMZ  Current Medications: Current Outpatient Medications  Medication Sig Dispense Refill  . aspirin 81 MG tablet Take 81 mg by mouth daily.    Marland Kitchen azelastine (ASTELIN) 0.1 % nasal spray Place 1 spray into both nostrils 2 (two) times daily. Use in each nostril as directed    . benztropine (COGENTIN) 1 MG tablet Take 1 tablet (1 mg total) by mouth 2 (two) times daily. 60 tablet 0  . citalopram (CELEXA) 20 MG tablet Take 1 tablet (20 mg total) by mouth daily. 30 tablet 0  . clonazePAM (KLONOPIN) 2 MG tablet Take 1 tablet (2 mg total) by mouth at bedtime. 30 tablet 2  . donepezil (ARICEPT) 10 MG tablet Take 1 tablet (10 mg total) by mouth at bedtime. 30 tablet 0  . fluticasone (FLONASE) 50 MCG/ACT nasal spray Place into both nostrils daily.    Marland Kitchen lisinopril (PRINIVIL,ZESTRIL) 10 MG tablet Take 10 mg by mouth daily.    Marland Kitchen loratadine (CLARITIN) 10 MG tablet Take 10 mg by mouth daily.    . meloxicam (MOBIC) 15 MG tablet Take 15 mg by mouth daily.    . metFORMIN (GLUCOPHAGE) 500 MG tablet  Take 500 mg by mouth 2 (two) times daily with a meal.     . pantoprazole (PROTONIX) 40 MG tablet TAKE 1 TABLET IN THE MORNING BEFORE BREAKFAST 28 tablet 5  . pravastatin (PRAVACHOL) 10 MG tablet Take 10 mg by mouth daily.    . risperidone (RISPERDAL) 4 MG tablet Take 1 tablet (4 mg total) by mouth 2 (two) times daily. 60 tablet 0  . traZODone (DESYREL) 100 MG tablet Take 1 tablet (100 mg total) by mouth at bedtime. 30 tablet 0   No current facility-administered medications for this visit.  Musculoskeletal: Strength & Muscle Tone: N/A Gait & Station: N/A Patient leans: N/A  Psychiatric Specialty Exam: Review of Systems  Psychiatric/Behavioral: Negative for depression, hallucinations, memory loss, substance abuse and suicidal ideas. The patient is not nervous/anxious and does not have insomnia.   All other systems reviewed and are negative.   There were no vitals taken for this visit.There is no height or weight on file to calculate BMI.  General Appearance: NA  Eye Contact:  NA  Speech:  Garbled  Volume:  Normal  Mood:  NA (garbled some words)  Affect:  NA  Thought Process:  Coherent  Orientation:  Full (Time, Place, and Person)  Thought Content: Logical   Suicidal Thoughts:  No  Homicidal Thoughts:  No  Memory:  Immediate;   Good  Judgement:  Fair  Insight:  Shallow  Psychomotor Activity:  Normal  Concentration:  Concentration: Good and Attention Span: Good  Recall:  Good  Fund of Knowledge: Good  Language: Good  Akathisia:  No  Handed:  Right  AIMS (if indicated): not done  Assets:  Communication Skills Desire for Improvement  ADL's:  Intact  Cognition: WNL  Sleep:  Good   Screenings:   Assessment and Plan:  Austin Mcdonald is a 58 y.o. year old male with a history of schizophrenia, tardive dyskinesia,hyperlipidemia, diabetes   , who presents for follow up appointment for Schizophrenia, unspecified type ([place])  # Schizophrenia There has been no significant  change since the last visit since tapering off Paliperidone.  He continues to have dysarthria and demonstrates linear thought process.  Although it is preferable to taper down Risperdal especially given his complaint of drooling, will stay on the current dose at this time given change in his environment (limited activity due to pandemic).  Will continue Risperdal to target schizophrenia.  Discussed potential metabolic side effect and EPS.  Noted that his drooling is likely more attributable to his sinus infection given he did not have this symptoms when he was on higher dose of antipsychotics.  Will continue Cogentin for EPS at this time; will consider tapering it down as indicated in the future.  Will continue clonazepam as needed for anxiety.  Will continue trazodone as needed for insomnia.   # r/o mild neurocognitive disorder He does have a cognitive deficits as characterized by [place].  Will continue donepezil for cognitive impairment.   Plan I have reviewed and updated plans as below 1. Continue Risperidone 4 mg twice a day 2.Continuecogentin 1 mg twice a day  3. Continue citalopram 20 mg daily 4. Continue donepezil 10 mg daily 5. Continue clonazepam 2 mg qhs 6. Continue Trazodone 100 mg at night 6.Next appointment: 9/8 at 9:40 for 20 mins, phone 7. Check TSH, Vitamin B 12, folate- reviewed, wnl Reviewedlabs- Hb1c 5.9%,Lipids wnl, 09/2017,    The patient demonstrates the following risk factors for suicide: Chronic risk factors for suicide include:psychiatric disorder ofschizophrenia. Acute risk factorsfor suicide include: unemployment. Protective factorsfor this patient include: positive social support. Considering these factors, the overall suicide risk at this point appears to below. Patientisappropriate for outpatient follow up.  [name], MD 09/02/2018, 11:35 AM

## 2018-09-02 ENCOUNTER — Encounter (HOSPITAL_COMMUNITY): Payer: Self-pay | Admitting: Psychiatry

## 2018-09-02 ENCOUNTER — Ambulatory Visit (INDEPENDENT_AMBULATORY_CARE_PROVIDER_SITE_OTHER): Payer: Medicaid Other | Admitting: Psychiatry

## 2018-09-02 ENCOUNTER — Other Ambulatory Visit: Payer: Self-pay

## 2018-09-02 DIAGNOSIS — F209 Schizophrenia, unspecified: Secondary | ICD-10-CM | POA: Diagnosis not present

## 2018-09-02 MED ORDER — DONEPEZIL HCL 10 MG PO TABS: 10.0000 mg | ORAL_TABLET | Freq: Every day | ORAL | 2 refills | Status: DC

## 2018-09-02 MED ORDER — CLONAZEPAM 2 MG PO TABS: 2.0000 mg | ORAL_TABLET | Freq: Every day | ORAL | 2 refills | Status: DC

## 2018-09-02 MED ORDER — CITALOPRAM HYDROBROMIDE 20 MG PO TABS: 20.0000 mg | ORAL_TABLET | Freq: Every day | ORAL | 2 refills | Status: DC

## 2018-09-02 MED ORDER — BENZTROPINE MESYLATE 1 MG PO TABS: 1.0000 mg | ORAL_TABLET | Freq: Two times a day (BID) | ORAL | 2 refills | Status: DC

## 2018-09-02 MED ORDER — RISPERIDONE 4 MG PO TABS: 4.0000 mg | ORAL_TABLET | Freq: Two times a day (BID) | ORAL | 2 refills | Status: DC

## 2018-09-02 MED ORDER — TRAZODONE HCL 100 MG PO TABS: 100.0000 mg | ORAL_TABLET | Freq: Every day | ORAL | 2 refills | Status: DC

## 2018-09-02 NOTE — Patient Instructions (Signed)
1. Continue Risperidone 4 mg twice a day 2.Continuecogentin 1 mg twice a day  3. Continue citalopram 20 mg daily 4. Continue donepezil 10 mg daily 5. Continue clonazepam 2 mg qhs 6. Continue Trazodone 100 mg at night 7.Next appointment: 9/8 at 9:40

## 2018-11-17 NOTE — Progress Notes (Signed)
Virtual Visit via Telephone Note  I connected with Austin Mcdonald on 12/01/18 at  9:40 AM EDT by telephone and verified that I am speaking with the correct person using two identifiers.   I discussed the limitations, risks, security and privacy concerns of performing an evaluation and management service by telephone and the availability of in person appointments. I also discussed with the patient that there may be a patient responsible charge related to this service. The patient expressed understanding and agreed to proceed.     I discussed the assessment and treatment plan with the patient. The patient was provided an opportunity to ask questions and all were answered. The patient agreed with the plan and demonstrated an understanding of the instructions.   The patient was advised to call back or seek an in-person evaluation if the symptoms worsen or if the condition fails to improve as anticipated.  I provided 15 minutes of non-face-to-face time during this encounter.   [name], MD    [facility] MD/PA/NP OP Progress Note  12/01/2018 10:05 AM Austin Mcdonald  MRN:  [id number]  Chief Complaint:  Chief Complaint    Follow-up; Schizophrenia     HPI:  This is a follow-up appointment for schizophrenia.  He states that he has been "settling down." Although he wants to "go home" where he has his own place, he understands to stay at the facility. He enjoys weather and sits in the chair. He also enjoys watching TV.  He denies insomnia.  He has good appetite as long as he eats food "that is not so heavy." He has fair concentration and energy. He denies SI, HI, AH, VH. He denies paranoia. He denies ideas of reference.   Austin Mcdonald presents to the interview.  There has been no change since the last visit. He takes medication regularly. No behavioral or safety concerns. His brother, mother may contact him at times. There has been improvement in drooling since the last visit.    Visit Diagnosis:   ICD-10-CM   1. Schizophrenia, unspecified type ([place])  F20.9     Past Psychiatric History: Please see initial evaluation for full details. I have reviewed the history. No updates at this time.     Past Medical History:  Past Medical History:  Diagnosis Date  . Depression   . Diabetes ([place])   . GERD (gastroesophageal reflux disease) 05/09/2016  . Schizophrenic disorder [facility])     Past Surgical History:  Procedure Laterality Date  . APPENDECTOMY    . COLONOSCOPY N/A 02/23/2015   Procedure: COLONOSCOPY;  Surgeon: Rogene Houston, MD;  [facility];  Service: Endoscopy;  Laterality: N/A;  . ESOPHAGEAL DILATION N/A 02/23/2015   Procedure: ESOPHAGEAL DILATION;  Surgeon: Rogene Houston, MD;  [facility];  Service: Endoscopy;  Laterality: N/A;  . ESOPHAGOGASTRODUODENOSCOPY N/A 02/23/2015   Procedure: ESOPHAGOGASTRODUODENOSCOPY (EGD);  Surgeon: Rogene Houston, MD;  [facility];  Service: Endoscopy;  Laterality: N/A;  2:00    Family Psychiatric History: Please see initial evaluation for full details. I have reviewed the history. No updates at this time.     Family History: No family history on file.  Social History:  Social History   Socioeconomic History  . Marital status: Single    Spouse name: Not on file  . Number of children: Not on file  . Years of education: Not on file  . Highest education level: Not on file  Occupational History  . Not  on file  Social Needs  . Financial resource strain: Not on file  . Food insecurity    Worry: Not on file    Inability: Not on file  . Transportation needs    Medical: Not on file    Non-medical: Not on file  Tobacco Use  . Smoking status: Current Every Day Smoker    Packs/day: 0.50    Types: Cigarettes  . Smokeless tobacco: Never Used  . Tobacco comment: 10 cigarettes a day  Substance and Sexual Activity  . Alcohol use: No    Alcohol/week: 0.0 standard drinks  . Drug use: No  . Sexual activity:  Not on file  Lifestyle  . Physical activity    Days per week: Not on file    Minutes per session: Not on file  . Stress: Not on file  Relationships  . Social Herbalist on phone: Not on file    Gets together: Not on file    Attends religious service: Not on file    Active member of club or organization: Not on file    Attends meetings of clubs or organizations: Not on file    Relationship status: Not on file  Other Topics Concern  . Not on file  Social History Narrative  . Not on file    Allergies: No Known Allergies  Metabolic Disorder Labs: No results found for: HGBA1C, MPG No results found for: PROLACTIN No results found for: CHOL, TRIG, HDL, CHOLHDL, VLDL, LDLCALC Lab Results  Component Value Date   TSH 0.52 12/08/2017    Therapeutic Level Labs: No results found for: LITHIUM No results found for: VALPROATE No components found for:  CBMZ  Current Medications: Current Outpatient Medications  Medication Sig Dispense Refill  . aspirin 81 MG tablet Take 81 mg by mouth daily.    Marland Kitchen azelastine (ASTELIN) 0.1 % nasal spray Place 1 spray into both nostrils 2 (two) times daily. Use in each nostril as directed    . benztropine (COGENTIN) 1 MG tablet Take 1 tablet (1 mg total) by mouth 2 (two) times daily. 60 tablet 2  . citalopram (CELEXA) 20 MG tablet Take 1 tablet (20 mg total) by mouth daily. 30 tablet 2  . clonazePAM (KLONOPIN) 2 MG tablet Take 1 tablet (2 mg total) by mouth at bedtime. 30 tablet 2  . donepezil (ARICEPT) 10 MG tablet Take 1 tablet (10 mg total) by mouth at bedtime. 30 tablet 2  . fluticasone (FLONASE) 50 MCG/ACT nasal spray Place into both nostrils daily.    Marland Kitchen lisinopril (PRINIVIL,ZESTRIL) 10 MG tablet Take 10 mg by mouth daily.    Marland Kitchen loratadine (CLARITIN) 10 MG tablet Take 10 mg by mouth daily.    . meloxicam (MOBIC) 15 MG tablet Take 15 mg by mouth daily.    . metFORMIN (GLUCOPHAGE) 500 MG tablet Take 500 mg by mouth 2 (two) times daily with a  meal.     . pantoprazole (PROTONIX) 40 MG tablet TAKE 1 TABLET IN THE MORNING BEFORE BREAKFAST 28 tablet 5  . pravastatin (PRAVACHOL) 10 MG tablet Take 10 mg by mouth daily.    . risperidone (RISPERDAL) 4 MG tablet Take 1 tablet (4 mg total) by mouth 2 (two) times daily. 60 tablet 2  . traZODone (DESYREL) 100 MG tablet Take 1 tablet (100 mg total) by mouth at bedtime. 30 tablet 2   No current facility-administered medications for this visit.      Musculoskeletal: Strength & Muscle Tone:  N/A Gait & Station: N/A Patient leans: N/A  Psychiatric Specialty Exam: Review of Systems  Psychiatric/Behavioral: Negative for depression, hallucinations, memory loss, substance abuse and suicidal ideas. The patient is not nervous/anxious and does not have insomnia.   All other systems reviewed and are negative.   There were no vitals taken for this visit.There is no height or weight on file to calculate BMI.  General Appearance: NA  Eye Contact:  NA  Speech:  Garbled  Volume:  Normal  Mood:  "settling down"  Affect:  NA  Thought Process:  Coherent  Orientation:  Full (Time, Place, and Person)  Thought Content: Logical   Suicidal Thoughts:  No  Homicidal Thoughts:  No  Memory:  Immediate;   Good  Judgement:  Fair  Insight:  Present  Psychomotor Activity:  Normal  Concentration:  Concentration: Good and Attention Span: Good  Recall:  Good  Fund of Knowledge: Good  Language: Good  Akathisia:  No  Handed:  Right  AIMS (if indicated): not done  Assets:  Communication Skills Desire for Improvement  ADL's:  Intact  Cognition: WNL  Sleep:  Good   Screenings:   Assessment and Plan:  Austin Mcdonald is a 58 y.o. year old male with a history of schizophrenia, tardive dyskinesia,hyperlipidemia, diabetes, who presents for follow up appointment for Schizophrenia, unspecified type ([place])  # Schizophrenia There has been no behavior issues or psychotic symptoms since her last visit.  He  continues to demonstrate dysarthria, but has linear thought process.  Will continue Risperdal to target schizophrenia. The hope is to slowly taper down this medication while he is adjusting to new lifestyle due to limited activity secondary to pandemic.  Discussed potential metabolic side effect and EPS.  Noted that reported hypersalivation is improving since the last visit; will continue to monitor.  Will continue Cogentin for EPS at this time; will consider tapering it down as indicated in the future.  Will continue clonazepam as needed for anxiety.  Will continue trazodone as needed for insomnia.   # r/o mild neurocognitive disorder He does have cognitive deficits as characterized by [place]. Will continue donepezil for cognitive impairment.   Plan I have reviewed and updated plans as below 1. Continue Risperidone 4 mg twice a day 2.Continuecogentin 1 mg twice a day  3. Continue citalopram 20 mg daily 4. Continue donepezil 10 mg daily 5. Continue clonazepam 2 mg qhs 6. Continue Trazodone 100 mg at night 6.Next appointment: 11.9 at 10:20 for 20 mins, phone 7. Check TSH, Vitamin B 12, folate- reviewed, wnl Reviewedlabs- Hb1c 5.9%,Lipids wnl, 09/2017,  Fax: [phone]  The patient demonstrates the following risk factors for suicide: Chronic risk factors for suicide include:psychiatric disorder ofschizophrenia. Acute risk factorsfor suicide include: unemployment. Protective factorsfor this patient include: positive social support. Considering these factors, the overall suicide risk at this point appears to below. Patientisappropriate for outpatient follow up.  [name], MD 12/01/2018, 10:05 AM

## 2018-12-01 ENCOUNTER — Ambulatory Visit (INDEPENDENT_AMBULATORY_CARE_PROVIDER_SITE_OTHER): Payer: Medicare Other | Admitting: Psychiatry

## 2018-12-01 ENCOUNTER — Other Ambulatory Visit: Payer: Self-pay

## 2018-12-01 ENCOUNTER — Encounter (HOSPITAL_COMMUNITY): Payer: Self-pay | Admitting: Psychiatry

## 2018-12-01 DIAGNOSIS — F209 Schizophrenia, unspecified: Secondary | ICD-10-CM

## 2018-12-01 MED ORDER — BENZTROPINE MESYLATE 1 MG PO TABS: 1.0000 mg | ORAL_TABLET | Freq: Two times a day (BID) | ORAL | 2 refills | Status: DC

## 2018-12-01 MED ORDER — CLONAZEPAM 2 MG PO TABS: 2.0000 mg | ORAL_TABLET | Freq: Every day | ORAL | 2 refills | Status: DC

## 2018-12-01 MED ORDER — TRAZODONE HCL 100 MG PO TABS: 100.0000 mg | ORAL_TABLET | Freq: Every day | ORAL | 2 refills | Status: DC

## 2018-12-01 MED ORDER — CITALOPRAM HYDROBROMIDE 20 MG PO TABS: 20.0000 mg | ORAL_TABLET | Freq: Every day | ORAL | 2 refills | Status: DC

## 2018-12-01 MED ORDER — RISPERIDONE 4 MG PO TABS: 4.0000 mg | ORAL_TABLET | Freq: Two times a day (BID) | ORAL | 2 refills | Status: DC

## 2018-12-01 MED ORDER — DONEPEZIL HCL 10 MG PO TABS: 10.0000 mg | ORAL_TABLET | Freq: Every day | ORAL | 2 refills | Status: DC

## 2018-12-01 NOTE — Patient Instructions (Signed)
1. Continue Risperidone 4 mg twice a day 2.Continuecogentin 1 mg twice a day  3. Continue citalopram 20 mg daily 4. Continue donepezil 10 mg daily 5. Continue clonazepam 2 mg qhs 6. Continue Trazodone 100 mg at night 7.Next appointment: 11.9 at 10:20

## 2019-01-25 NOTE — Progress Notes (Signed)
Virtual Visit via Telephone Note  I connected with Austin Mcdonald on 02/01/19 at 10:20 AM EST by telephone and verified that I am speaking with the correct person using two identifiers.   I discussed the limitations, risks, security and privacy concerns of performing an evaluation and management service by telephone and the availability of in person appointments. I also discussed with the patient that there may be a patient responsible charge related to this service. The patient expressed understanding and agreed to proceed.      I discussed the assessment and treatment plan with the patient. The patient was provided an opportunity to ask questions and all were answered. The patient agreed with the plan and demonstrated an understanding of the instructions.   The patient was advised to call back or seek an in-person evaluation if the symptoms worsen or if the condition fails to improve as anticipated.  I provided 15 minutes of non-face-to-face time during this encounter.   [name], MD    [facility] MD/PA/NP OP Progress Note  02/01/2019 10:44 AM Austin Mcdonald  MRN:  [id number]  Chief Complaint:  Chief Complaint    Schizophrenia; Follow-up     HPI:  This is a follow-up appointment for schizophrenia.  He states that he has been doing good.  He feels good about that Joe Biden Beat Trump. He enjoys watching TV. He also feels good as he does not have pain for the past few weeks. He had a good weekend. He denies insomnia.  He denies feeling depressed.  He has good motivation and energy.  He denies SI.  He denies anxiety.  Has good appetite.  He denies SI, HI, AH, VH.  He denies paranoia.   Ms. [name] at group home presents to the interview.  She states that he has been doing well. He now has his own room, and enjoys watching TV. He may go out for smoking or have side walk. Although he is mostly content with not seeing anybody, he tends to miss his family during holiday season. His family  used to take him home during holiday season, although she is unsure the plan this year due to pandemic. No safety or behavioral concern. He takes medication regularly.   Functional Status Activities of Daily Living (ADLs):  Austin Mcdonald is independent in the following:  feeding, continence, grooming and toileting, walking, hygiene Require assistance: bathing (to prevent fall)   Visit Diagnosis:    ICD-10-CM   1. Schizophrenia, unspecified type ([place])  F20.9     Past Psychiatric History: Please see initial evaluation for full details. I have reviewed the history. No updates at this time.     Past Medical History:  Past Medical History:  Diagnosis Date  . Depression   . Diabetes ([place])   . GERD (gastroesophageal reflux disease) 05/09/2016  . Schizophrenic disorder [facility])     Past Surgical History:  Procedure Laterality Date  . APPENDECTOMY    . COLONOSCOPY N/A 02/23/2015   Procedure: COLONOSCOPY;  Surgeon: Rogene Houston, MD;  [facility];  Service: Endoscopy;  Laterality: N/A;  . ESOPHAGEAL DILATION N/A 02/23/2015   Procedure: ESOPHAGEAL DILATION;  Surgeon: Rogene Houston, MD;  [facility];  Service: Endoscopy;  Laterality: N/A;  . ESOPHAGOGASTRODUODENOSCOPY N/A 02/23/2015   Procedure: ESOPHAGOGASTRODUODENOSCOPY (EGD);  Surgeon: Rogene Houston, MD;  [facility];  Service: Endoscopy;  Laterality: N/A;  2:00    Family Psychiatric History: Please see initial evaluation  for full details. I have reviewed the history. No updates at this time.     Family History: No family history on file.  Social History:  Social History   Socioeconomic History  . Marital status: Single    Spouse name: Not on file  . Number of children: Not on file  . Years of education: Not on file  . Highest education level: Not on file  Occupational History  . Not on file  Social Needs  . Financial resource strain: Not on file  . Food insecurity    Worry: Not on  file    Inability: Not on file  . Transportation needs    Medical: Not on file    Non-medical: Not on file  Tobacco Use  . Smoking status: Current Every Day Smoker    Packs/day: 0.50    Types: Cigarettes  . Smokeless tobacco: Never Used  . Tobacco comment: 10 cigarettes a day  Substance and Sexual Activity  . Alcohol use: No    Alcohol/week: 0.0 standard drinks  . Drug use: No  . Sexual activity: Not on file  Lifestyle  . Physical activity    Days per week: Not on file    Minutes per session: Not on file  . Stress: Not on file  Relationships  . Social Herbalist on phone: Not on file    Gets together: Not on file    Attends religious service: Not on file    Active member of club or organization: Not on file    Attends meetings of clubs or organizations: Not on file    Relationship status: Not on file  Other Topics Concern  . Not on file  Social History Narrative  . Not on file    Allergies: No Known Allergies  Metabolic Disorder Labs: No results found for: HGBA1C, MPG No results found for: PROLACTIN No results found for: CHOL, TRIG, HDL, CHOLHDL, VLDL, LDLCALC Lab Results  Component Value Date   TSH 0.52 12/08/2017    Therapeutic Level Labs: No results found for: LITHIUM No results found for: VALPROATE No components found for:  CBMZ  Current Medications: Current Outpatient Medications  Medication Sig Dispense Refill  . aspirin 81 MG tablet Take 81 mg by mouth daily.    Marland Kitchen azelastine (ASTELIN) 0.1 % nasal spray Place 1 spray into both nostrils 2 (two) times daily. Use in each nostril as directed    . benztropine (COGENTIN) 1 MG tablet Take 1 tablet (1 mg total) by mouth 2 (two) times daily. 60 tablet 4  . citalopram (CELEXA) 20 MG tablet Take 1 tablet (20 mg total) by mouth daily. 30 tablet 4  . clonazePAM (KLONOPIN) 2 MG tablet Take 1 tablet (2 mg total) by mouth at bedtime. 30 tablet 4  . donepezil (ARICEPT) 10 MG tablet Take 1 tablet (10 mg total)  by mouth at bedtime. 30 tablet 4  . fluticasone (FLONASE) 50 MCG/ACT nasal spray Place into both nostrils daily.    Marland Kitchen lisinopril (PRINIVIL,ZESTRIL) 10 MG tablet Take 10 mg by mouth daily.    Marland Kitchen loratadine (CLARITIN) 10 MG tablet Take 10 mg by mouth daily.    . meloxicam (MOBIC) 15 MG tablet Take 15 mg by mouth daily.    . metFORMIN (GLUCOPHAGE) 500 MG tablet Take 500 mg by mouth 2 (two) times daily with a meal.     . pantoprazole (PROTONIX) 40 MG tablet TAKE 1 TABLET IN THE MORNING BEFORE BREAKFAST 28 tablet  5  . pravastatin (PRAVACHOL) 10 MG tablet Take 10 mg by mouth daily.    . risperidone (RISPERDAL) 4 MG tablet Take 1 tablet (4 mg total) by mouth 2 (two) times daily. 60 tablet 4  . traZODone (DESYREL) 100 MG tablet Take 1 tablet (100 mg total) by mouth at bedtime. 30 tablet 4   No current facility-administered medications for this visit.      Musculoskeletal: Strength & Muscle Tone: N/A Gait & Station: N/A Patient leans: N/A  Psychiatric Specialty Exam: Review of Systems  Psychiatric/Behavioral: Positive for memory loss. Negative for depression, hallucinations, substance abuse and suicidal ideas. The patient is not nervous/anxious and does not have insomnia.   All other systems reviewed and are negative.   There were no vitals taken for this visit.There is no height or weight on file to calculate BMI.  General Appearance: NA  Eye Contact:  NA  Speech:  Garbled  Volume:  Normal  Mood:  "good"  Affect:  NA  Thought Process:  Coherent  Orientation:  Full (Time, Place, and Person)  Thought Content: Logical   Suicidal Thoughts:  No  Homicidal Thoughts:  No  Memory:  Immediate;   Good  Judgement:  Good  Insight:  Present  Psychomotor Activity:  Normal  Concentration:  Concentration: Good and Attention Span: Good  Recall:  Good  Fund of Knowledge: Good  Language: Good  Akathisia:  No  Handed:  Right  AIMS (if indicated): not done  Assets:  Social Support  ADL's:  Intact   Cognition: Impaired,  Mild  Sleep:  Good   Screenings:   Assessment and Plan:  Austin Mcdonald is a 58 y.o. year old male with a history of schizophrenia,  tardive dyskinesia,hyperlipidemia, diabetes, who presents for follow up appointment for Schizophrenia, unspecified type ([place])  # Schizophrenia There has been no behavioral issues or psychotic symptoms since the last visit.  Although he continues to have dysarthria, he demonstrates linear thought process.  We will continue risperidone to target schizophrenia.  The hope is to slowly taper down this medication, although will hold this change given anticipated stress during holiday seasons due to limited activity in the setting of pandemic.  Discussed potential metabolic side effect and EPS.  We will continue Cogentin at this time for EPS.  We will consider tapering it down as indicated in the future.  Will continue clonazepam as needed for anxiety.  We will continue trazodone as needed for insomnia.   # r/o mild neurocognitive disorder He does have cognitive deficits as characterized by Moca.  Will continue donepezil for cognitive impairment.   Plan I have reviewed and updated plans as below 1.Continue Risperidone 4 mg twice a day 2.Continuecogentin 1 mg twice a day 3. Continue citalopram 20 mg daily 4. Continue donepezil 10 mg daily 5. Continue clonazepam 2 mg qhs 6. Continue Trazodone 100 mg at night 6.Next appointment: 3/3 at 9 AM for 20 mins, phone 7. Check TSH, Vitamin B 12, folate- reviewed, wnl Reviewedlabs- Hb1c 5.9%,Lipids wnl, 09/2017, Last PCP visit on 10/22 (will have blood test in 6 weeks) Fax: [phone]   The patient demonstrates the following risk factors for suicide: Chronic risk factors for suicide include:psychiatric disorder ofschizophrenia. Acute risk factorsfor suicide include: unemployment. Protective factorsfor this patient include: positive social support. Considering these factors, the  overall suicide risk at this point appears to below. Patientisappropriate for outpatient follow up.  [name], MD 02/01/2019, 10:44 AM

## 2019-02-01 ENCOUNTER — Other Ambulatory Visit: Payer: Self-pay

## 2019-02-01 ENCOUNTER — Ambulatory Visit (INDEPENDENT_AMBULATORY_CARE_PROVIDER_SITE_OTHER): Payer: Medicaid Other | Admitting: Psychiatry

## 2019-02-01 ENCOUNTER — Encounter (HOSPITAL_COMMUNITY): Payer: Self-pay | Admitting: Psychiatry

## 2019-02-01 DIAGNOSIS — F209 Schizophrenia, unspecified: Secondary | ICD-10-CM | POA: Diagnosis not present

## 2019-02-01 MED ORDER — RISPERIDONE 4 MG PO TABS: 4.0000 mg | ORAL_TABLET | Freq: Two times a day (BID) | ORAL | 4 refills | Status: DC

## 2019-02-01 MED ORDER — CITALOPRAM HYDROBROMIDE 20 MG PO TABS: 20.0000 mg | ORAL_TABLET | Freq: Every day | ORAL | 4 refills | Status: DC

## 2019-02-01 MED ORDER — CLONAZEPAM 2 MG PO TABS: 2.0000 mg | ORAL_TABLET | Freq: Every day | ORAL | 4 refills | Status: DC

## 2019-02-01 MED ORDER — BENZTROPINE MESYLATE 1 MG PO TABS: 1.0000 mg | ORAL_TABLET | Freq: Two times a day (BID) | ORAL | 4 refills | Status: DC

## 2019-02-01 MED ORDER — DONEPEZIL HCL 10 MG PO TABS: 10.0000 mg | ORAL_TABLET | Freq: Every day | ORAL | 4 refills | Status: DC

## 2019-02-01 MED ORDER — TRAZODONE HCL 100 MG PO TABS: 100.0000 mg | ORAL_TABLET | Freq: Every day | ORAL | 4 refills | Status: DC

## 2019-02-01 NOTE — Patient Instructions (Signed)
1.Continue Risperidone 4 mg twice a day 2.Continuecogentin 1 mg twice a day 3. Continue citalopram 20 mg daily 4. Continue donepezil 10 mg daily 5. Continue clonazepam 2 mg qhs 6. Continue Trazodone 100 mg at night 6.Next appointment: 3/3 at 9 AM

## 2019-02-23 ENCOUNTER — Encounter (INDEPENDENT_AMBULATORY_CARE_PROVIDER_SITE_OTHER): Payer: Self-pay | Admitting: *Deleted

## 2019-05-13 ENCOUNTER — Ambulatory Visit (INDEPENDENT_AMBULATORY_CARE_PROVIDER_SITE_OTHER): Payer: Medicare Other | Admitting: Gastroenterology

## 2019-05-17 ENCOUNTER — Ambulatory Visit (INDEPENDENT_AMBULATORY_CARE_PROVIDER_SITE_OTHER): Payer: Medicare Other | Admitting: Gastroenterology

## 2019-05-19 NOTE — Progress Notes (Signed)
Virtual Visit via Telephone Note  I connected with Austin Mcdonald on 05/26/19 at  9:00 AM EST by telephone and verified that I am speaking with the correct person using two identifiers.   I discussed the limitations, risks, security and privacy concerns of performing an evaluation and management service by telephone and the availability of in person appointments. I also discussed with the patient that there may be a patient responsible charge related to this service. The patient expressed understanding and agreed to proceed.      I discussed the assessment and treatment plan with the patient. The patient was provided an opportunity to ask questions and all were answered. The patient agreed with the plan and demonstrated an understanding of the instructions.   The patient was advised to call back or seek an in-person evaluation if the symptoms worsen or if the condition fails to improve as anticipated.  I provided 12 minutes of non-face-to-face time during this encounter.   [name], MD   [facility] MD/PA/NP OP Progress Note  05/26/2019 9:39 AM Austin Mcdonald  MRN:  [id number]  Chief Complaint:  Chief Complaint    Schizophrenia; Follow-up     HPI:  This is a follow-up appointment for schizophrenia.  He states that he has been doing good.  He has been trying to keep his blood pressure in good control.  He enjoys watching TV.  He would like to clean the floor. He swept the side walk last Friday. He denies feeling depressed or anxious. He denies SI, HI. He denies AH, VH, paranoia. He denies ideas of reference. He has good appetite. He denies insomnia.  Ms. [name] presents to the interview.  There has been no significant change since the last visit.  He is calm, no safety or behavioral concerns.  He takes medication regularly. No known tremors.    Functional Status Activities of Daily Living (ADLs):  Austin Mcdonald is independent in the following:  feeding, continence, grooming and  toileting, walking, hygiene Require assistance: bathing (to prevent fall)  Visit Diagnosis:    ICD-10-CM   1. Schizophrenia, unspecified type ([place])  F20.9     Past Psychiatric History: Please see initial evaluation for full details. I have reviewed the history. No updates at this time.     Past Medical History:  Past Medical History:  Diagnosis Date  . Depression   . Diabetes ([place])   . GERD (gastroesophageal reflux disease) 05/09/2016  . Schizophrenic disorder [facility])     Past Surgical History:  Procedure Laterality Date  . APPENDECTOMY    . COLONOSCOPY N/A 02/23/2015   Procedure: COLONOSCOPY;  Surgeon: Rogene Houston, MD;  [facility];  Service: Endoscopy;  Laterality: N/A;  . ESOPHAGEAL DILATION N/A 02/23/2015   Procedure: ESOPHAGEAL DILATION;  Surgeon: Rogene Houston, MD;  [facility];  Service: Endoscopy;  Laterality: N/A;  . ESOPHAGOGASTRODUODENOSCOPY N/A 02/23/2015   Procedure: ESOPHAGOGASTRODUODENOSCOPY (EGD);  Surgeon: Rogene Houston, MD;  [facility];  Service: Endoscopy;  Laterality: N/A;  2:00    Family Psychiatric History: Please see initial evaluation for full details. I have reviewed the history. No updates at this time.     Family History: No family history on file.  Social History:  Social History   Socioeconomic History  . Marital status: Single    Spouse name: Not on file  . Number of children: Not on file  . Years of education: Not on file  .  Highest education level: Not on file  Occupational History  . Not on file  Tobacco Use  . Smoking status: Current Every Day Smoker    Packs/day: 0.50    Types: Cigarettes  . Smokeless tobacco: Never Used  . Tobacco comment: 10 cigarettes a day  Substance and Sexual Activity  . Alcohol use: No    Alcohol/week: 0.0 standard drinks  . Drug use: No  . Sexual activity: Not on file  Other Topics Concern  . Not on file  Social History Narrative  . Not on file   Social  Determinants of Health   Financial Resource Strain:   . Difficulty of Paying Living Expenses: Not on file  Food Insecurity:   . Worried About Charity fundraiser in the Last Year: Not on file  . Ran Out of Food in the Last Year: Not on file  Transportation Needs:   . Lack of Transportation (Medical): Not on file  . Lack of Transportation (Non-Medical): Not on file  Physical Activity:   . Days of Exercise per Week: Not on file  . Minutes of Exercise per Session: Not on file  Stress:   . Feeling of Stress : Not on file  Social Connections:   . Frequency of Communication with Friends and Family: Not on file  . Frequency of Social Gatherings with Friends and Family: Not on file  . Attends Religious Services: Not on file  . Active Member of Clubs or Organizations: Not on file  . Attends Archivist Meetings: Not on file  . Marital Status: Not on file    Allergies: No Known Allergies  Metabolic Disorder Labs: No results found for: HGBA1C, MPG No results found for: PROLACTIN No results found for: CHOL, TRIG, HDL, CHOLHDL, VLDL, LDLCALC Lab Results  Component Value Date   TSH 0.52 12/08/2017    Therapeutic Level Labs: No results found for: LITHIUM No results found for: VALPROATE No components found for:  CBMZ  Current Medications: Current Outpatient Medications  Medication Sig Dispense Refill  . aspirin 81 MG tablet Take 81 mg by mouth daily.    Marland Kitchen azelastine (ASTELIN) 0.1 % nasal spray Place 1 spray into both nostrils 2 (two) times daily. Use in each nostril as directed    . [START ON 06/24/2019] benztropine (COGENTIN) 1 MG tablet Take 1 tablet (1 mg total) by mouth 2 (two) times daily. 60 tablet 3  . [START ON 06/24/2019] citalopram (CELEXA) 20 MG tablet Take 1 tablet (20 mg total) by mouth daily. 30 tablet 3  . [START ON 06/24/2019] clonazePAM (KLONOPIN) 2 MG tablet Take 1 tablet (2 mg total) by mouth at bedtime. 30 tablet 3  . [START ON 06/24/2019] donepezil (ARICEPT) 10  MG tablet Take 1 tablet (10 mg total) by mouth at bedtime. 30 tablet 3  . fluticasone (FLONASE) 50 MCG/ACT nasal spray Place into both nostrils daily.    Marland Kitchen lisinopril (PRINIVIL,ZESTRIL) 10 MG tablet Take 10 mg by mouth daily.    Marland Kitchen loratadine (CLARITIN) 10 MG tablet Take 10 mg by mouth daily.    . meloxicam (MOBIC) 15 MG tablet Take 15 mg by mouth daily.    . metFORMIN (GLUCOPHAGE) 500 MG tablet Take 500 mg by mouth 2 (two) times daily with a meal.     . pantoprazole (PROTONIX) 40 MG tablet TAKE 1 TABLET IN THE MORNING BEFORE BREAKFAST 28 tablet 5  . pravastatin (PRAVACHOL) 10 MG tablet Take 10 mg by mouth daily.    Marland Kitchen  risperidone (RISPERDAL) 3 MG tablet 3 mg in the morning, 4 mg at night (take along with 4 mg tab) 30 tablet 3  . risperidone (RISPERDAL) 4 MG tablet 3 mg in the morning, 4 mg at night (take along with 3 mg tab) 30 tablet 3  . traZODone (DESYREL) 100 MG tablet Take 1 tablet (100 mg total) by mouth at bedtime. 30 tablet 4   No current facility-administered medications for this visit.     Musculoskeletal: Strength & Muscle Tone: N/A Gait & Station: N/A Patient leans: N/A  Psychiatric Specialty Exam: Review of Systems  Psychiatric/Behavioral: Negative for agitation, behavioral problems, confusion, decreased concentration, dysphoric mood, hallucinations, self-injury, sleep disturbance and suicidal ideas. The patient is not nervous/anxious and is not hyperactive.   All other systems reviewed and are negative.   There were no vitals taken for this visit.There is no height or weight on file to calculate BMI.  General Appearance: NA  Eye Contact:  NA  Speech:  Garbled  Volume:  Normal  Mood:  "great"  Affect:  NA  Thought Process:  Coherent  Orientation:  Full (Time, Place, and Person)  Thought Content: Logical   Suicidal Thoughts:  No  Homicidal Thoughts:  No  Memory:  Immediate;   Good  Judgement:  Fair  Insight:  Present  Psychomotor Activity:  Normal  Concentration:   Concentration: Good and Attention Span: Good  Recall:  Good  Fund of Knowledge: Good  Language: Good  Akathisia:  No  Handed:  Right  AIMS (if indicated): not done  Assets:  Communication Skills Desire for Improvement  ADL's:  Intact  Cognition: WNL  Sleep:  Good   Screenings:   Assessment and Plan:  Austin Mcdonald is a 59 y.o. year old male with a history of schizophrenia, tardive dyskinesia, hyperlipidemia, diabetes, who presents for follow up appointment for Schizophrenia, unspecified type ([place])  # Schizophrenia There has been no significant behavioral issues or psychotic symptoms since the last visit.  Although he does have dysarthria, he continues to demonstrate linear thought process. Will taper down risperidone (for schizophrenia) slowly to avoid polypharmacy. Ms. [name] is advised to contact the office if any worsening in his behavior/thought process.  Discussed potential metabolic side effect and EPS.  We will continue Cogentin at this time for EPS with plan to slowly taper down in the future.  We will continue citalopram for depression/anxiety.  Will continue clonazepam as needed for anxiety.  We will continue trazodone as needed for insomnia.   # r/o mild neurocognitive disorder He does have a cognitive deficits as characterized by Moca.  We will continue donepezil for cognitive impairment.   Plan I have reviewed and updated plans as below 1.Decrease Risperidone 3 in the morning, 4 mg at night  2.Continuecogentin 1 mg twice a day 3. Continue citalopram 20 mg daily 4. Continue donepezil 10 mg daily 5. Continue clonazepam 2 mg qhs 6. Continue Trazodone 100 mg at night 6.Next appointment:6/8 at 8:50 for 20 mins, phone Fax: [phone] 7. Check TSH, Vitamin B 12, folate- reviewed, wnl Reviewedlabs- Hb1c 5.9%,Lipids wnl, 09/2017,Last PCP visit on 10/22 (will have blood test in 6 weeks)  The patient demonstrates the following risk factors for suicide:  Chronic risk factors for suicide include:psychiatric disorder ofschizophrenia. Acute risk factorsfor suicide include: unemployment. Protective factorsfor this patient include: positive social support. Considering these factors, the overall suicide risk at this point appears to below. Patientisappropriate for outpatient follow up.  [name], MD 05/26/2019,  9:39 AM

## 2019-05-26 ENCOUNTER — Ambulatory Visit (INDEPENDENT_AMBULATORY_CARE_PROVIDER_SITE_OTHER): Payer: Medicare Other | Admitting: Psychiatry

## 2019-05-26 ENCOUNTER — Other Ambulatory Visit: Payer: Self-pay

## 2019-05-26 ENCOUNTER — Encounter (HOSPITAL_COMMUNITY): Payer: Self-pay | Admitting: Psychiatry

## 2019-05-26 DIAGNOSIS — F209 Schizophrenia, unspecified: Secondary | ICD-10-CM | POA: Diagnosis not present

## 2019-05-26 MED ORDER — CLONAZEPAM 2 MG PO TABS: 2.0000 mg | ORAL_TABLET | Freq: Every day | ORAL | 3 refills | Status: DC

## 2019-05-26 MED ORDER — CITALOPRAM HYDROBROMIDE 20 MG PO TABS: 20.0000 mg | ORAL_TABLET | Freq: Every day | ORAL | 3 refills | Status: DC

## 2019-05-26 MED ORDER — RISPERIDONE 4 MG PO TABS: ORAL_TABLET | ORAL | 3 refills | Status: DC

## 2019-05-26 MED ORDER — RISPERIDONE 3 MG PO TABS: ORAL_TABLET | ORAL | 3 refills | Status: DC

## 2019-05-26 MED ORDER — BENZTROPINE MESYLATE 1 MG PO TABS: 1.0000 mg | ORAL_TABLET | Freq: Two times a day (BID) | ORAL | 3 refills | Status: DC

## 2019-05-26 MED ORDER — DONEPEZIL HCL 10 MG PO TABS: 10.0000 mg | ORAL_TABLET | Freq: Every day | ORAL | 3 refills | Status: DC

## 2019-05-26 NOTE — Patient Instructions (Addendum)
1.Decrease Risperidone 3 in the morning, 4 mg at night  2.Continuecogentin 1 mg twice a day 3. Continue citalopram 20 mg daily 4. Continue donepezil 10 mg daily 5. Continue clonazepam 2 mg qhs 6. Continue Trazodone 100 mg at night 6.Next appointment:6/8 at 8:50  Fax: [phone]

## 2019-06-24 ENCOUNTER — Other Ambulatory Visit (HOSPITAL_COMMUNITY): Payer: Self-pay | Admitting: Psychiatry

## 2019-06-24 MED ORDER — TRAZODONE HCL 100 MG PO TABS: 100.0000 mg | ORAL_TABLET | Freq: Every day | ORAL | 2 refills | Status: DC

## 2019-08-20 ENCOUNTER — Telehealth (HOSPITAL_COMMUNITY): Payer: Self-pay | Admitting: Psychiatry

## 2019-08-20 NOTE — Telephone Encounter (Signed)
Per Epic, it stated that all of these scripts was received by pharmacy and the pharmacy confirmed they received it. Staff called pharmacy and spoke with pharmacist Lenna Sciara and she stated its just their system that prompts them to send for more once theres no more refills and if the patient needs an appt at least its like a warning for the providers office to get the patient in for an appt. Informed Melissa with what provider stated and she verbalized understanding.

## 2019-08-20 NOTE — Telephone Encounter (Signed)
Could you contact [facility] about their refill requests.   clonazepam, benztropine, citalopram, donepezil: these are ordered last month for 4 months. Please verify they received these orders.  Risperidone 4 mg and 3 mg: theses are ordered in March for 4 months.  Please verify they received these orders.   Please inform them that he has a follow up with me on 6/8,  and he should have enough medication until then.

## 2019-08-26 ENCOUNTER — Ambulatory Visit (INDEPENDENT_AMBULATORY_CARE_PROVIDER_SITE_OTHER): Payer: Medicare Other | Admitting: Gastroenterology

## 2019-08-26 ENCOUNTER — Encounter (INDEPENDENT_AMBULATORY_CARE_PROVIDER_SITE_OTHER): Payer: Self-pay | Admitting: Gastroenterology

## 2019-08-26 ENCOUNTER — Other Ambulatory Visit: Payer: Self-pay

## 2019-08-26 VITALS — BP 148/74 | HR 51 | Temp 97.2°F | Ht 73.0 in | Wt 198.1 lb

## 2019-08-26 DIAGNOSIS — K219 Gastro-esophageal reflux disease without esophagitis: Secondary | ICD-10-CM

## 2019-08-26 MED ORDER — PANTOPRAZOLE SODIUM 40 MG PO TBEC: 40.0000 mg | DELAYED_RELEASE_TABLET | Freq: Every day | ORAL | 11 refills | Status: DC

## 2019-08-26 NOTE — Patient Instructions (Signed)
Take protonix 30 min before breakfast. Take mobic w/ food to decrease GI irritation. Call w/ any swallowing issues prior to 1 year follow up

## 2019-08-26 NOTE — Progress Notes (Signed)
Patient profile: Austin Mcdonald is a 59 y.o. male seen for follow up. Last seen in clinic 04/2018  History of Present Illness: Austin Mcdonald is seen today for follow-up. He is accompanied by a caregiver from his group home. He denies any dysphagia as long as he eats slowly, usually group home staff chops his meats up. He denies any GERD symptoms, nausea, vomiting. No concerns about appetite.  Denies any issues with bowels unless he drinks a large volume of soda which triggers diarrhea. This is infrequent. He otherwise denies any constipation, diarrhea, melena, rectal bleeding. Caregiver feels he is doing well.    Wt Readings from Last 3 Encounters:  08/26/19 198 lb 1.6 oz (89.9 kg)  05/13/18 200 lb 4.8 oz (90.9 kg)  05/09/17 199 lb 3.2 oz (90.4 kg)     Last Colonoscopy: 2016-Examination performed to cecum. Redundant colon. Small cecal polyp ablated via cold biopsy. Two small diverticula noted as above. Prominent internal hemorrhoids with small external hemorrhoids 7 year repeat recommended.   Last Endoscopy: 2016-No evidence of esophageal stricture or ring formation. Gastritis and body and antrum with multiple erosions and a small antral ulcer. Multiple biopsies taken from mucosa at gastric body for routine histology. Esophagus dilated by passing 56 French Maloney dilator but no disruption noted to esophageal mucosa.   Past Medical History:  Past Medical History:  Diagnosis Date  . Depression   . Diabetes ([place])   . GERD (gastroesophageal reflux disease) 05/09/2016  . Schizophrenic disorder ([place])     Problem List: Patient Active Problem List   Diagnosis Date Noted  . Schizophrenia ([place]) 07/22/2017  . Dysphagia 05/09/2017  . Diarrhea 05/09/2017  . GERD (gastroesophageal reflux disease) 05/09/2016  . Diabetes ([place]) 01/12/2015  . Depression 01/12/2015    Past Surgical History: Past Surgical History:  Procedure Laterality Date  . APPENDECTOMY    . COLONOSCOPY N/A  02/23/2015   Procedure: COLONOSCOPY;  Surgeon: Rogene Houston, MD;  [facility];  Service: Endoscopy;  Laterality: N/A;  . ESOPHAGEAL DILATION N/A 02/23/2015   Procedure: ESOPHAGEAL DILATION;  Surgeon: Rogene Houston, MD;  [facility];  Service: Endoscopy;  Laterality: N/A;  . ESOPHAGOGASTRODUODENOSCOPY N/A 02/23/2015   Procedure: ESOPHAGOGASTRODUODENOSCOPY (EGD);  Surgeon: Rogene Houston, MD;  [facility];  Service: Endoscopy;  Laterality: N/A;  2:00    Allergies: No Known Allergies    Home Medications:  Current Outpatient Medications:  .  aspirin 81 MG tablet, Take 81 mg by mouth daily., Disp: , Rfl:  .  azelastine (ASTELIN) 0.1 % nasal spray, Place 1 spray into both nostrils 2 (two) times daily. Use in each nostril as directed, Disp: , Rfl:  .  benztropine (COGENTIN) 1 MG tablet, Take 1 tablet (1 mg total) by mouth 2 (two) times daily., Disp: 60 tablet, Rfl: 3 .  citalopram (CELEXA) 20 MG tablet, Take 1 tablet (20 mg total) by mouth daily., Disp: 30 tablet, Rfl: 3 .  clonazePAM (KLONOPIN) 2 MG tablet, Take 1 tablet (2 mg total) by mouth at bedtime., Disp: 30 tablet, Rfl: 3 .  donepezil (ARICEPT) 10 MG tablet, Take 1 tablet (10 mg total) by mouth at bedtime., Disp: 30 tablet, Rfl: 3 .  fluticasone (FLONASE) 50 MCG/ACT nasal spray, Place into both nostrils daily., Disp: , Rfl:  .  lisinopril (PRINIVIL,ZESTRIL) 10 MG tablet, Take 10 mg by mouth daily., Disp: , Rfl:  .  loratadine (CLARITIN) 10 MG tablet, Take 10 mg  by mouth daily., Disp: , Rfl:  .  meloxicam (MOBIC) 15 MG tablet, Take 15 mg by mouth daily., Disp: , Rfl:  .  metFORMIN (GLUCOPHAGE) 500 MG tablet, Take 500 mg by mouth 2 (two) times daily with a meal. , Disp: , Rfl:  .  pantoprazole (PROTONIX) 40 MG tablet, TAKE 1 TABLET IN THE MORNING BEFORE BREAKFAST, Disp: 28 tablet, Rfl: 5 .  pravastatin (PRAVACHOL) 10 MG tablet, Take 10 mg by mouth daily., Disp: , Rfl:  .  risperidone (RISPERDAL) 3 MG  tablet, 3 mg in the morning, 4 mg at night (take along with 4 mg tab), Disp: 30 tablet, Rfl: 3 .  risperidone (RISPERDAL) 4 MG tablet, 3 mg in the morning, 4 mg at night (take along with 3 mg tab), Disp: 30 tablet, Rfl: 3 .  traZODone (DESYREL) 100 MG tablet, Take 1 tablet (100 mg total) by mouth at bedtime., Disp: 30 tablet, Rfl: 2   Family History: family history is not on file.    Social History:   reports that he has been smoking cigarettes. He has been smoking about 0.50 packs per day. He has never used smokeless tobacco. He reports that he does not drink alcohol or use drugs.   Review of Systems: Constitutional: Denies weight loss/weight gain  Eyes: No changes in vision. ENT: No oral lesions, sore throat.  GI: see HPI.  Heme/Lymph: No easy bruising.  CV: No chest pain.  GU: No hematuria.  Integumentary: No rashes.  Neuro: No headaches.  Psych: No depression/anxiety.  Endocrine: No heat/cold intolerance.  Allergic/Immunologic: No urticaria.  Resp: No cough, SOB.  Musculoskeletal: No joint swelling.    Physical Examination: BP (!) 148/74 (BP Location: Left Arm, Patient Position: Sitting, Cuff Size: Large)   Pulse (!) 51   Temp (!) 97.2 F (36.2 C) (Temporal)   Ht 6\' 1"  (1.854 m)   Wt 198 lb 1.6 oz (89.9 kg)   BMI 26.14 kg/m  Gen: NAD, alert and oriented x 4 HEENT: PEERLA, EOMI, Neck: supple, no JVD Chest: CTA bilaterally, no wheezes, crackles, or other adventitious sounds CV: RRR, no m/g/c/r Abd: soft, NT, ND, +BS in all four quadrants; no HSM, guarding, ridigity, or rebound tenderness Ext: no edema, well perfused with 2+ pulses, Skin: no rash or lesions noted on observed skin Lymph: no noted LAD      Assessment/Plan: Mr. Paradee is a 59 y.o. male  1. GERD-diet modifications reviewed. Continue PPI and chopping foods well and eating slowly. He is to contact me if he has any return dysphagia before 1 year, otherwise follow-up 1 year  2. History of colon  polyps-due for colonoscopy 2023  Follow-up 1 year-sooner if needed  There are no diagnoses linked to this encounter.   Recommendations:   I personally performed the service, non-incident to. (WP)  Laurine Blazer, [facility]

## 2019-08-31 ENCOUNTER — Other Ambulatory Visit: Payer: Self-pay

## 2019-08-31 ENCOUNTER — Telehealth (INDEPENDENT_AMBULATORY_CARE_PROVIDER_SITE_OTHER): Payer: Medicare Other | Admitting: Psychiatry

## 2019-08-31 ENCOUNTER — Encounter (HOSPITAL_COMMUNITY): Payer: Self-pay | Admitting: Psychiatry

## 2019-08-31 ENCOUNTER — Telehealth (HOSPITAL_COMMUNITY): Payer: Self-pay | Admitting: *Deleted

## 2019-08-31 ENCOUNTER — Other Ambulatory Visit (HOSPITAL_COMMUNITY): Payer: Self-pay | Admitting: Psychiatry

## 2019-08-31 DIAGNOSIS — Z13220 Encounter for screening for lipoid disorders: Secondary | ICD-10-CM

## 2019-08-31 DIAGNOSIS — F209 Schizophrenia, unspecified: Secondary | ICD-10-CM

## 2019-08-31 MED ORDER — DONEPEZIL HCL 10 MG PO TABS: 10.0000 mg | ORAL_TABLET | Freq: Every day | ORAL | 3 refills | Status: DC

## 2019-08-31 MED ORDER — TRAZODONE HCL 100 MG PO TABS: 100.0000 mg | ORAL_TABLET | Freq: Every day | ORAL | 3 refills | Status: DC

## 2019-08-31 MED ORDER — CLONAZEPAM 2 MG PO TABS: 2.0000 mg | ORAL_TABLET | Freq: Every day | ORAL | 3 refills | Status: DC

## 2019-08-31 MED ORDER — RISPERIDONE 3 MG PO TABS: ORAL_TABLET | ORAL | 3 refills | Status: DC

## 2019-08-31 MED ORDER — CITALOPRAM HYDROBROMIDE 20 MG PO TABS: 20.0000 mg | ORAL_TABLET | Freq: Every day | ORAL | 3 refills | Status: DC

## 2019-08-31 MED ORDER — BENZTROPINE MESYLATE 0.5 MG PO TABS: 0.5000 mg | ORAL_TABLET | Freq: Two times a day (BID) | ORAL | 3 refills | Status: DC

## 2019-08-31 MED ORDER — RISPERIDONE 4 MG PO TABS: ORAL_TABLET | ORAL | 3 refills | Status: DC

## 2019-08-31 MED ORDER — BENZTROPINE MESYLATE 0.5 MG PO TABS: 1.0000 mg | ORAL_TABLET | Freq: Two times a day (BID) | ORAL | 3 refills | Status: DC

## 2019-08-31 NOTE — Patient Instructions (Signed)
1. Continue Risperidone 3 in the morning, 4 mg at night  2.Decrease cogentin 0.5  mg twice a day 3. Continue citalopram 20 mg daily 4. Continue donepezil 10 mg daily 5. Continue clonazepam 2 mg qhs 6. Continue Trazodone 100 mg at night 6.Next appointment: 9/8 at 10 AM  7. Obtain blood test for lipid panels. Glucose  - Please do fasting before getting blood test

## 2019-08-31 NOTE — Telephone Encounter (Signed)
Cogentin 0.5 mg BID was ordered to the pharmacy. Please let me know if they requested any action in addition to this.

## 2019-08-31 NOTE — Addendum Note (Signed)
Addended by: [name] on: 08/31/2019 01:01 PM   Modules accepted: Orders

## 2019-08-31 NOTE — Progress Notes (Addendum)
Virtual Visit via Telephone Note  I connected with Austin Mcdonald on 08/31/19 at  9:00 AM EDT by telephone and verified that I am speaking with the correct person using two identifiers.   I discussed the limitations, risks, security and privacy concerns of performing an evaluation and management service by telephone and the availability of in person appointments. I also discussed with the patient that there may be a patient responsible charge related to this service. The patient expressed understanding and agreed to proceed.     I discussed the assessment and treatment plan with the patient. The patient was provided an opportunity to ask questions and all were answered. The patient agreed with the plan and demonstrated an understanding of the instructions.   The patient was advised to call back or seek an in-person evaluation if the symptoms worsen or if the condition fails to improve as anticipated.  Location: patient- facility/[facility], provider- home office   I provided 15 minutes of non-face-to-face time during this encounter.   [name], MD    [facility] MD/PA/NP OP Progress Note  08/31/2019 11:04 AM Austin Mcdonald  MRN:  [id number]  Chief Complaint:  Chief Complaint    Follow-up; Schizophrenia     HPI:  This is a follow-up appointment for schizophrenia.  He states that he has been doing all right.  He tries to keep his mind and body straight.  He states that lies down, smokes cigarette when he is asked about his daily routine. He occasionally has dizziness. No fall episode. He states that he does not come to see me when he is doing good, and he needs to come to see me when he is not doing good when he is asked if he has any questions.  He denies insomnia.  He denies feeling depressed or anxiety.  He denies AH, VH.  He denies SI.  He denies paranoia.   [name], his caregiver presents to the interview.  There has been no significant change in his behavior since tapering down  Risperdal.  No significant concern.  When he is asked about Florentino speaking more on today's visit, she states that he occasionally has episodes like this, and she denies any concern. He takes medication regularly. He has good appetite.   Activities of Daily Living (ADLs): Austin Mcdonald independent in the following: feeding, continence, grooming and toileting, walking, hygiene Require assistance:bathing(to prevent fall)  198 lbs  Visit Diagnosis:    ICD-10-CM   1. Schizophrenia, unspecified type ([place])  D40.8 Basic Metabolic Panel (BMET)    CANCELED: Lipid Profile  2. Lipid screening  Z13.220 Lipid Profile    Past Psychiatric History: Please see initial evaluation for full details. I have reviewed the history. No updates at this time.     Past Medical History:  Past Medical History:  Diagnosis Date  . Depression   . Diabetes ([place])   . GERD (gastroesophageal reflux disease) 05/09/2016  . Schizophrenic disorder [facility])     Past Surgical History:  Procedure Laterality Date  . APPENDECTOMY    . COLONOSCOPY N/A 02/23/2015   Procedure: COLONOSCOPY;  Surgeon: Rogene Houston, MD;  [facility];  Service: Endoscopy;  Laterality: N/A;  . ESOPHAGEAL DILATION N/A 02/23/2015   Procedure: ESOPHAGEAL DILATION;  Surgeon: Rogene Houston, MD;  [facility];  Service: Endoscopy;  Laterality: N/A;  . ESOPHAGOGASTRODUODENOSCOPY N/A 02/23/2015   Procedure: ESOPHAGOGASTRODUODENOSCOPY (EGD);  Surgeon: Rogene Houston, MD;  [facility];  Service: Endoscopy;  Laterality: N/A;  2:00    Family Psychiatric History: Please see initial evaluation for full details. I have reviewed the history. No updates at this time.     Family History: History reviewed. No pertinent family history.  Social History:  Social History   Socioeconomic History  . Marital status: Single    Spouse name: Not on file  . Number of children: Not on file  . Years of education: Not on file   . Highest education level: Not on file  Occupational History  . Not on file  Tobacco Use  . Smoking status: Current Every Day Smoker    Packs/day: 0.50    Types: Cigarettes  . Smokeless tobacco: Never Used  . Tobacco comment: 10 cigarettes a day  Substance and Sexual Activity  . Alcohol use: No    Alcohol/week: 0.0 standard drinks  . Drug use: No  . Sexual activity: Not on file  Other Topics Concern  . Not on file  Social History Narrative  . Not on file   Social Determinants of Health   Financial Resource Strain:   . Difficulty of Paying Living Expenses:   Food Insecurity:   . Worried About Charity fundraiser in the Last Year:   . Arboriculturist in the Last Year:   Transportation Needs:   . Film/video editor (Medical):   Austin Mcdonald Lack of Transportation (Non-Medical):   Physical Activity:   . Days of Exercise per Week:   . Minutes of Exercise per Session:   Stress:   . Feeling of Stress :   Social Connections:   . Frequency of Communication with Friends and Family:   . Frequency of Social Gatherings with Friends and Family:   . Attends Religious Services:   . Active Member of Clubs or Organizations:   . Attends Archivist Meetings:   Austin Mcdonald Marital Status:     Allergies: No Known Allergies  Metabolic Disorder Labs: No results found for: HGBA1C, MPG No results found for: PROLACTIN No results found for: CHOL, TRIG, HDL, CHOLHDL, VLDL, LDLCALC Lab Results  Component Value Date   TSH 0.52 12/08/2017    Therapeutic Level Labs: No results found for: LITHIUM No results found for: VALPROATE No components found for:  CBMZ  Current Medications: Current Outpatient Medications  Medication Sig Dispense Refill  . aspirin 81 MG tablet Take 81 mg by mouth daily.    Austin Mcdonald azelastine (ASTELIN) 0.1 % nasal spray Place 1 spray into both nostrils 2 (two) times daily. Use in each nostril as directed    . benztropine (COGENTIN) 0.5 MG tablet Take 1 tablet (0.5 mg total) by  mouth 2 (two) times daily. 60 tablet 3  . citalopram (CELEXA) 20 MG tablet Take 1 tablet (20 mg total) by mouth daily. 30 tablet 3  . clonazePAM (KLONOPIN) 2 MG tablet Take 1 tablet (2 mg total) by mouth at bedtime. 30 tablet 3  . donepezil (ARICEPT) 10 MG tablet Take 1 tablet (10 mg total) by mouth at bedtime. 30 tablet 3  . fluticasone (FLONASE) 50 MCG/ACT nasal spray Place into both nostrils daily.    Austin Mcdonald lisinopril (PRINIVIL,ZESTRIL) 10 MG tablet Take 10 mg by mouth daily.    Austin Mcdonald loratadine (CLARITIN) 10 MG tablet Take 10 mg by mouth daily.    . meloxicam (MOBIC) 15 MG tablet Take 15 mg by mouth daily.    . metFORMIN (GLUCOPHAGE) 500 MG tablet Take 500 mg by mouth 2 (  two) times daily with a meal.     . pantoprazole (PROTONIX) 40 MG tablet Take 1 tablet (40 mg total) by mouth daily. 30 tablet 11  . pravastatin (PRAVACHOL) 10 MG tablet Take 10 mg by mouth daily.    . risperiDONE (RISPERDAL) 3 MG tablet 3 mg in the morning, 4 mg at night (take along with 4 mg tab) 30 tablet 3  . risperidone (RISPERDAL) 4 MG tablet 3 mg in the morning, 4 mg at night (take along with 3 mg tab) 30 tablet 3  . traZODone (DESYREL) 100 MG tablet Take 1 tablet (100 mg total) by mouth at bedtime. 30 tablet 3   No current facility-administered medications for this visit.     Musculoskeletal: Strength & Muscle Tone: N/A Gait & Station: N/A Patient leans: N/A  Psychiatric Specialty Exam: Review of Systems  Psychiatric/Behavioral: Negative for agitation, behavioral problems, confusion, decreased concentration, dysphoric mood, hallucinations, self-injury, sleep disturbance and suicidal ideas. The patient is not nervous/anxious and is not hyperactive.   All other systems reviewed and are negative.   There were no vitals taken for this visit.There is no height or weight on file to calculate BMI.  General Appearance: NA  Eye Contact:  NA  Speech:  Slurred, slightly verbose, but redirectable  Volume:  Normal  Mood:   good  Affect:  N/A  Thought Process:  Coherent, circumstantial at times  Orientation:  Full (Time, Place, and Person)  Thought Content: Logical   Suicidal Thoughts:  No  Homicidal Thoughts:  No  Memory:  Immediate;   Good  Judgement:  Good  Insight:  Present  Psychomotor Activity:  Normal  Concentration:  Concentration: Good and Attention Span: Good  Recall:  Good  Fund of Knowledge: Good  Language: Good  Akathisia:  No  Handed:  Right  AIMS (if indicated): not done  Assets:  Social Support  ADL's:  Intact  Cognition: WNL  Sleep:  Good   Screenings:   Assessment and Plan:  Austin Mcdonald is a 59 y.o. year old male with a history of  schizophrenia, tardive dyskinesia, hyperlipidemia, diabetes, who presents for follow up appointment for below.   1. Schizophrenia, unspecified type ([place]) Although he demonstrates slightly more verbose speech, this is considered at his baseline episode according to the caregiver. Will continue current dose of risperidone at this time to avoid relapse in his symptoms. Will taper down cogentin to avoid its potential side effect while monitoring EPS.  Will continue citalopram to target depression and anxiety.  Will continue clonazepam as needed for anxiety.  We will continue trazodone as needed for insomnia.   2. Lipid screening Will obtain BS, lipid to monitor metabolic side effect associated with risperidone.    # r/o mild neurocognitive disorder He does have a cognitive deficits as characterized by [place].  We will continue donepezil for cognitive impairment.   Plan I have reviewed and updated plans as below 1. Continue Risperidone 3 in the morning, 4 mg at night  2.Decrease cogentin 0.5  mg twice a day 3. Continue citalopram 20 mg daily 4. Continue donepezil 10 mg daily 5. Continue clonazepam 2 mg qhs 6. Continue Trazodone 100 mg at night 7.Next appointment: 9/8 at 10 AM for 20 mins, phone Fax: [phone] 8. Obtain blood test for  lipid panels. glucose -TSH, Vitamin B 12, folate- reviewed, wnl Reviewed[facility]abs- Hb1c 5.9%,Lipids wnl, 09/2017,Last PCP visit on 10/22 (will have blood test in 6 weeks) PCP   The patient demonstrates  the following risk factors for suicide: Chronic risk factors for suicide include:psychiatric disorder ofschizophrenia. Acute risk factorsfor suicide include: unemployment. Protective factorsfor this patient include: positive social support. Considering these factors, the overall suicide risk at this point appears to below. Patientisappropriate for outpatient follow up.  [name], MD 08/31/2019, 11:04 AM

## 2019-08-31 NOTE — Telephone Encounter (Signed)
noted 

## 2019-08-31 NOTE — Telephone Encounter (Signed)
Per provider to please contact [employer], and advise that cogentin dose was reduced to 0.5 mg twice a day. Please cancel cogentin 1 mg twice a day order if he has any refills  Spoke with Austin Mcdonald and informed him with what provider stated and he verbalized understanding. Pre Austin Mcdonald to inform provider to please change her records to 0.5 mg BID so that it does not cause any confusion with future refills.

## 2019-09-10 ENCOUNTER — Other Ambulatory Visit (HOSPITAL_COMMUNITY): Payer: Self-pay | Admitting: Psychiatry

## 2019-09-10 ENCOUNTER — Telehealth (HOSPITAL_COMMUNITY): Payer: Self-pay | Admitting: *Deleted

## 2019-09-10 ENCOUNTER — Telehealth (HOSPITAL_COMMUNITY): Payer: Self-pay | Admitting: Psychiatry

## 2019-09-10 DIAGNOSIS — E785 Hyperlipidemia, unspecified: Secondary | ICD-10-CM

## 2019-09-10 LAB — BASIC METABOLIC PANEL
BUN: 14 mg/dL (ref 7–25)
CO2: 30 mmol/L (ref 20–32)
Calcium: 9.1 mg/dL (ref 8.6–10.3)
Chloride: 108 mmol/L (ref 98–110)
Creat: 0.82 mg/dL (ref 0.70–1.25)
Glucose, Bld: 93 mg/dL (ref 65–99)
Potassium: 4.2 mmol/L (ref 3.5–5.3)
Sodium: 142 mmol/L (ref 135–146)

## 2019-09-10 LAB — LIPID PANEL
Cholesterol: 140 mg/dL (ref <200)
HDL: 45 mg/dL (ref >=40)
LDL Cholesterol (Calc): 85 mg/dL (calc)
Non-HDL Cholesterol (Calc): 95 mg/dL (calc) (ref <130)
Total CHOL/HDL Ratio: 3.1 (calc) (ref <5.0)
Triglycerides: 36 mg/dL (ref <150)

## 2019-09-10 NOTE — Telephone Encounter (Signed)
[facility] called stating that the diagnoses code for pt Lipid panel Medicare will not pay for it and it is costing the patient  $135 out of pocket. Per [facility] they have to have the order before they can draw the patient. I asked them if they new a diagnoses code that Medicare would pay for and they said they did not know.

## 2019-09-10 NOTE — Telephone Encounter (Signed)
Lab ordered.

## 2019-09-10 NOTE — Telephone Encounter (Signed)
Ordered

## 2019-09-10 NOTE — Telephone Encounter (Signed)
Noted called [facility] to let them know

## 2019-09-13 ENCOUNTER — Encounter (HOSPITAL_COMMUNITY): Payer: Self-pay | Admitting: Psychiatry

## 2019-11-25 NOTE — Progress Notes (Signed)
Virtual Visit via Telephone Note  I connected with Austin Mcdonald on 12/01/19 at 10:00 AM EDT by telephone and verified that I am speaking with the correct person using two identifiers.   I discussed the limitations, risks, security and privacy concerns of performing an evaluation and management service by telephone and the availability of in person appointments. I also discussed with the patient that there may be a patient responsible charge related to this service. The patient expressed understanding and agreed to proceed.    I discussed the assessment and treatment plan with the patient. The patient was provided an opportunity to ask questions and all were answered. The patient agreed with the plan and demonstrated an understanding of the instructions.   The patient was advised to call back or seek an in-person evaluation if the symptoms worsen or if the condition fails to improve as anticipated.  Location: patient- group home, provider- home office   I provided 14 minutes of non-face-to-face time during this encounter.   [name], MD    [facility] MD/PA/NP OP Progress Note  12/01/2019 10:23 AM Austin Mcdonald  MRN:  [id number]  Chief Complaint:  Chief Complaint    Follow-up; Schizophrenia     HPI:  This is a follow-up appointment for schizophrenia.  He is relatively a poor historian, and he mumbles on the phone.  He states that he has been doing well.  When he is asked about his hobbies, he answers that he smokes cigarettes.  He feels "good in bed at night."  He sleeps well.  He has good appetite.  He denies feeling depressed.  He denies SI, HI, hallucinations or paranoia.   Ms. Austin Mcdonald is available on the phone.  There has been no change lately.  He smokes cigarette during the day.  He has good appetite.  He sleeps well at night.  Ms. Austin Mcdonald does not notice any tremors.  Ms. Austin Mcdonald denies any behavioral concern.  He takes medication regularly.   Austin Mcdonald independent  in the following: feeding, continence, grooming and toileting, walking, hygiene Require assistance:bathing(to prevent fall)  Visit Diagnosis:    ICD-10-CM   1. Schizophrenia, unspecified type ([place])  F20.9     Past Psychiatric History: Please see initial evaluation for full details. I have reviewed the history. No updates at this time.     Past Medical History:  Past Medical History:  Diagnosis Date  . Depression   . Diabetes ([place])   . GERD (gastroesophageal reflux disease) 05/09/2016  . Schizophrenic disorder [facility])     Past Surgical History:  Procedure Laterality Date  . APPENDECTOMY    . COLONOSCOPY N/A 02/23/2015   Procedure: COLONOSCOPY;  Surgeon: Rogene [place], MD;  [facility];  Service: Endoscopy;  Laterality: N/A;  . ESOPHAGEAL DILATION N/A 02/23/2015   Procedure: ESOPHAGEAL DILATION;  Surgeon: Rogene [place], MD;  [facility];  Service: Endoscopy;  Laterality: N/A;  . ESOPHAGOGASTRODUODENOSCOPY N/A 02/23/2015   Procedure: ESOPHAGOGASTRODUODENOSCOPY (EGD);  Surgeon: Rogene [place], MD;  Location: [facility];  Service: Endoscopy;  Laterality: N/A;  2:00    Family Psychiatric History: Please see initial evaluation for full details. I have reviewed the history. No updates at this time.     Family History: History reviewed. No pertinent family history.  Social History:  Social History   Socioeconomic History  . Marital status: Single    Spouse name: Not on file  . Number of children: Not  on file  . Years of education: Not on file  . Highest education level: Not on file  Occupational History  . Not on file  Tobacco Use  . Smoking status: Current Every Day Smoker    Packs/day: 0.50    Types: Cigarettes  . Smokeless tobacco: Never Used  . Tobacco comment: 10 cigarettes a day  Substance and Sexual Activity  . Alcohol use: No    Alcohol/week: 0.0 standard drinks  . Drug use: No  . Sexual activity: Not on file  Other Topics Concern   . Not on file  Social History Narrative  . Not on file   Social Determinants of Health   Financial Resource Strain:   . Difficulty of Paying Living Expenses: Not on file  Food Insecurity:   . Worried About Charity fundraiser in the Last Year: Not on file  . Ran Out of Food in the Last Year: Not on file  Transportation Needs:   . Lack of Transportation (Medical): Not on file  . Lack of Transportation (Non-Medical): Not on file  Physical Activity:   . Days of Exercise per Week: Not on file  . Minutes of Exercise per Session: Not on file  Stress:   . Feeling of Stress : Not on file  Social Connections:   . Frequency of Communication with Friends and Family: Not on file  . Frequency of Social Gatherings with Friends and Family: Not on file  . Attends Religious Services: Not on file  . Active Member of Clubs or Organizations: Not on file  . Attends Archivist Meetings: Not on file  . Marital Status: Not on file    Allergies: No Known Allergies  Metabolic Disorder Labs: No results found for: HGBA1C, MPG No results found for: PROLACTIN Lab Results  Component Value Date   CHOL 140 09/10/2019   TRIG 36 09/10/2019   HDL 45 09/10/2019   CHOLHDL 3.1 09/10/2019   LDLCALC 85 09/10/2019   Lab Results  Component Value Date   TSH 0.52 12/08/2017    Therapeutic Level Labs: No results found for: LITHIUM No results found for: VALPROATE No components found for:  CBMZ  Current Medications: Current Outpatient Medications  Medication Sig Dispense Refill  . cetirizine (ZYRTEC) 10 MG tablet Take 10 mg by mouth daily.    Marland Kitchen aspirin 81 MG tablet Take 81 mg by mouth daily.    Marland Kitchen azelastine (ASTELIN) 0.1 % nasal spray Place 1 spray into both nostrils 2 (two) times daily. Use in each nostril as directed    . benztropine (COGENTIN) 0.5 MG tablet Take 1 tablet (0.5 mg total) by mouth 2 (two) times daily. 60 tablet 3  . citalopram (CELEXA) 20 MG tablet Take 1 tablet (20 mg total)  by mouth daily. 30 tablet 2  . [START ON 01/03/2020] clonazePAM (KLONOPIN) 2 MG tablet Take 1 tablet (2 mg total) by mouth at bedtime. 30 tablet 2  . donepezil (ARICEPT) 10 MG tablet Take 1 tablet (10 mg total) by mouth at bedtime. 30 tablet 2  . fluticasone (FLONASE) 50 MCG/ACT nasal spray Place into both nostrils daily.    Marland Kitchen lisinopril (PRINIVIL,ZESTRIL) 10 MG tablet Take 10 mg by mouth daily.    Marland Kitchen loratadine (CLARITIN) 10 MG tablet Take 10 mg by mouth daily.    . meloxicam (MOBIC) 15 MG tablet Take 15 mg by mouth daily.    . metFORMIN (GLUCOPHAGE) 500 MG tablet Take 500 mg by mouth 2 (two) times  daily with a meal.     . pantoprazole (PROTONIX) 40 MG tablet Take 1 tablet (40 mg total) by mouth daily. 30 tablet 11  . pravastatin (PRAVACHOL) 10 MG tablet Take 10 mg by mouth daily.    . risperiDONE (RISPERDAL) 3 MG tablet 3 mg in the morning, 4 mg at night (take along with 4 mg tab) 30 tablet 2  . risperidone (RISPERDAL) 4 MG tablet 3 mg in the morning, 4 mg at night (take along with 3 mg tab) 30 tablet 2  . traZODone (DESYREL) 100 MG tablet Take 1 tablet (100 mg total) by mouth at bedtime. 30 tablet 2   No current facility-administered medications for this visit.     Musculoskeletal: Strength & Muscle Tone: N/A Gait & Station: N/A Patient leans: N/A  Psychiatric Specialty Exam: Review of Systems  Psychiatric/Behavioral: Negative for agitation, behavioral problems, confusion, decreased concentration, dysphoric mood, hallucinations, self-injury, sleep disturbance and suicidal ideas. The patient is not nervous/anxious and is not hyperactive.   All other systems reviewed and are negative.   There were no vitals taken for this visit.There is no height or weight on file to calculate BMI.  General Appearance: NA  Eye Contact:  NA  Speech:  Garbled  Volume:  Normal  Mood:  good  Affect:  NA  Thought Process:  Coherent  Orientation:  Full (Time, Place, and Person)  Thought Content: no  paranoia   Suicidal Thoughts:  No  Homicidal Thoughts:  No  Memory:  Immediate;   Good  Judgement:  Fair  Insight:  Shallow  Psychomotor Activity:  Normal  Concentration:  Concentration: Good and Attention Span: Good  Recall:  Good  Fund of Knowledge: Good  Language: Good  Akathisia:  No  Handed:  Right  AIMS (if indicated): not done  Assets:  Communication Skills Desire for Improvement  ADL's:  Intact  Cognition: WNL  Sleep:  Good   Screenings:   Assessment and Plan:  Austin Mcdonald is a 59 y.o. year old male with a history of schizophrenia, tardive dyskinesia,hyperlipidemia, diabetes, who presents for follow up appointment for below.    1. Schizophrenia, unspecified type ([place]) Although he mumbles at time on the encounter, there has been no significant change since the last visit according to the caregiver. Will continue current medication regimen except Cogentin to avoid polypharmacy.  Will continue Risperdal to target schizophrenia.  Discussed potential metabolic side effect and EPS.  We will continue citalopram to target depression and anxiety.  Will continue clonazepam as needed for anxiety.  Will continue trazodone as needed for insomnia.   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  Plan I have reviewed and updated plans as below 1. Continue Risperidone 3 in the morning, 4 mg at night 2.Discontinue benztropine  3. Continue citalopram 20 mg daily 4. Continue donepezil 10 mg daily 5. Continue clonazepam 2 mg qhs 6. Continue Trazodone 100 mg at night 7.Next appointment:12/1 at 11:20 for 20 mins, phone Fax: [phone] Reviewedlabs- checked June 2021. BMP, lipid panels wnl  I have utilized the [place] Controlled Substances Reporting System (PMP AWARxE) to confirm adherence regarding the patient's medication. My review reveals appropriate prescription fills.     The patient demonstrates the following risk factors for suicide: Chronic risk factors for suicide include:psychiatric disorder ofschizophrenia. Acute risk factorsfor suicide include: unemployment. Protective factorsfor this patient include: positive social support. Considering these factors, the overall  suicide risk at this point appears to below. Patientisappropriate for outpatient follow up.  [name], MD 12/01/2019, 10:23 AM

## 2019-12-01 ENCOUNTER — Telehealth (HOSPITAL_COMMUNITY): Payer: Self-pay | Admitting: Psychiatry

## 2019-12-01 ENCOUNTER — Telehealth (INDEPENDENT_AMBULATORY_CARE_PROVIDER_SITE_OTHER): Payer: Medicare Other | Admitting: Psychiatry

## 2019-12-01 ENCOUNTER — Other Ambulatory Visit: Payer: Self-pay

## 2019-12-01 ENCOUNTER — Encounter (HOSPITAL_COMMUNITY): Payer: Self-pay | Admitting: Psychiatry

## 2019-12-01 DIAGNOSIS — F209 Schizophrenia, unspecified: Secondary | ICD-10-CM | POA: Diagnosis not present

## 2019-12-01 MED ORDER — DONEPEZIL HCL 10 MG PO TABS
10.0000 mg | ORAL_TABLET | Freq: Every day | ORAL | 2 refills | Status: DC
Start: 2019-12-01 — End: 2020-01-24

## 2019-12-01 MED ORDER — CITALOPRAM HYDROBROMIDE 20 MG PO TABS
20.0000 mg | ORAL_TABLET | Freq: Every day | ORAL | 2 refills | Status: DC
Start: 2019-12-01 — End: 2020-01-24

## 2019-12-01 MED ORDER — TRAZODONE HCL 100 MG PO TABS
100.0000 mg | ORAL_TABLET | Freq: Every day | ORAL | 2 refills | Status: DC
Start: 2019-12-01 — End: 2020-01-24

## 2019-12-01 MED ORDER — RISPERIDONE 3 MG PO TABS
ORAL_TABLET | ORAL | 2 refills | Status: DC
Start: 2019-12-01 — End: 2020-01-24

## 2019-12-01 MED ORDER — RISPERIDONE 4 MG PO TABS
ORAL_TABLET | ORAL | 2 refills | Status: DC
Start: 2019-12-01 — End: 2020-01-24

## 2019-12-01 MED ORDER — CLONAZEPAM 2 MG PO TABS: 2.0000 mg | ORAL_TABLET | Freq: Every day | ORAL | 2 refills | Status: DC

## 2019-12-01 NOTE — Telephone Encounter (Signed)
Could you contact [facility], and ask benztropine to be discontinued? Please also make sure to cancel any refills of that medication. Thanks.

## 2019-12-01 NOTE — Patient Instructions (Signed)
1. Continue Risperidone 3 in the morning, 4 mg at night 2.Discontinue benztropine  3. Continue citalopram 20 mg daily 4. Continue donepezil 10 mg daily 5. Continue clonazepam 2 mg qhs 6. Continue Trazodone 100 mg at night 7.Next appointment:12/1 at 11:20

## 2020-01-24 ENCOUNTER — Other Ambulatory Visit (HOSPITAL_COMMUNITY): Payer: Self-pay | Admitting: Psychiatry

## 2020-01-24 MED ORDER — RISPERIDONE 4 MG PO TABS
ORAL_TABLET | ORAL | 1 refills | Status: DC
Start: 2020-01-24 — End: 2020-05-24

## 2020-01-24 MED ORDER — RISPERIDONE 3 MG PO TABS
ORAL_TABLET | ORAL | 1 refills | Status: DC
Start: 2020-01-24 — End: 2020-02-23

## 2020-01-24 MED ORDER — DONEPEZIL HCL 10 MG PO TABS
10.0000 mg | ORAL_TABLET | Freq: Every day | ORAL | 1 refills | Status: DC
Start: 2020-01-24 — End: 2020-02-23

## 2020-01-24 MED ORDER — CITALOPRAM HYDROBROMIDE 20 MG PO TABS
20.0000 mg | ORAL_TABLET | Freq: Every day | ORAL | 1 refills | Status: DC
Start: 2020-01-24 — End: 2020-02-23

## 2020-01-24 MED ORDER — TRAZODONE HCL 100 MG PO TABS
100.0000 mg | ORAL_TABLET | Freq: Every day | ORAL | 1 refills | Status: DC
Start: 2020-01-24 — End: 2020-02-23

## 2020-02-10 NOTE — Progress Notes (Signed)
Virtual Visit via Telephone Note  I connected with Austin Mcdonald on 02/23/20 at 11:20 AM EST by telephone and verified that I am speaking with the correct person using two identifiers.  Location: Patient: group home Provider: office Persons participated in the visit- patient, provider, [name], caregiver   I discussed the limitations, risks, security and privacy concerns of performing an evaluation and management service by telephone and the availability of in person appointments. I also discussed with the patient that there may be a patient responsible charge related to this service. The patient expressed understanding and agreed to proceed.   I discussed the assessment and treatment plan with the patient. The patient was provided an opportunity to ask questions and all were answered. The patient agreed with the plan and demonstrated an understanding of the instructions.   The patient was advised to call back or seek an in-person evaluation if the symptoms worsen or if the condition fails to improve as anticipated.  I provided 12 minutes of non-face-to-face time during this encounter.   Austin Clay, MD    [facility] MD/PA/NP OP Progress Note  02/23/2020 12:18 PM VIRAAJ VORNDRAN  MRN:  [id number]  Chief Complaint:  Chief Complaint    Other; Schizophrenia     HPI:  This is a follow up appointment for schizophrenia.  He states that he has been doing well.  He went home on Thanksgiving, and helps his brother to make some lease.  He enjoys watching TV, listening to the radio, gospel music, R&B and rock.  When he is asked to explore if he has any paranoia, he states that he needs to be ready for anything.  He feels comfortable staying at the group home.  He states that his roommate occasionally aggravates him.  He tries to have good relationship with this roommate, and tres to make things better for everybody.  He denies insomnia.  He has good appetite.  He denies feeling depressed.  He denies  AH, VH, SI, HI.  He denies ideas of reference.   Austin Mcdonald presents to the interview.  She states that there has been no change since the last visit.  She has not noticed any difference since discontinuation of Cogentin.  Austin. [name] does not notice any tremors.  No known behavior/safety concern.   Austin Fried Warrenis independent in the following: feeding, continence, grooming and toileting, walking, hygiene Require assistance:bathing(to prevent fall)  Visit Diagnosis:    ICD-10-CM   1. Schizophrenia, unspecified type ([place])  F20.9     Past Psychiatric History: Please see initial evaluation for full details. I have reviewed the history. No updates at this time.     Past Medical History:  Past Medical History:  Diagnosis Date  . Depression   . Diabetes ([place])   . GERD (gastroesophageal reflux disease) 05/09/2016  . Schizophrenic disorder [facility])     Past Surgical History:  Procedure Laterality Date  . APPENDECTOMY    . COLONOSCOPY N/A 02/23/2015   Procedure: COLONOSCOPY;  Surgeon: Rogene [place], MD;  [facility];  Service: Endoscopy;  Laterality: N/A;  . ESOPHAGEAL DILATION N/A 02/23/2015   Procedure: ESOPHAGEAL DILATION;  Surgeon: Rogene [place], MD;  [facility];  Service: Endoscopy;  Laterality: N/A;  . ESOPHAGOGASTRODUODENOSCOPY N/A 02/23/2015   Procedure: ESOPHAGOGASTRODUODENOSCOPY (EGD);  Surgeon: Rogene [place], MD;  [facility];  Service: Endoscopy;  Laterality: N/A;  2:00    Family Psychiatric History: Please see initial evaluation for  full details. I have reviewed the history. No updates at this time.     Family History: No family history on file.  Social History:  Social History   Socioeconomic History  . Marital status: Single    Spouse name: Not on file  . Number of children: Not on file  . Years of education: Not on file  . Highest education level: Not on file  Occupational History  . Not on file  Tobacco Use  .  Smoking status: Current Every Day Smoker    Packs/day: 0.50    Types: Cigarettes  . Smokeless tobacco: Never Used  . Tobacco comment: 10 cigarettes a day  Substance and Sexual Activity  . Alcohol use: No    Alcohol/week: 0.0 standard drinks  . Drug use: No  . Sexual activity: Not on file  Other Topics Concern  . Not on file  Social History Narrative  . Not on file   Social Determinants of Health   Financial Resource Strain:   . Difficulty of Paying Living Expenses: Not on file  Food Insecurity:   . Worried About Charity fundraiser in the Last Year: Not on file  . Ran Out of Food in the Last Year: Not on file  Transportation Needs:   . Lack of Transportation (Medical): Not on file  . Lack of Transportation (Non-Medical): Not on file  Physical Activity:   . Days of Exercise per Week: Not on file  . Minutes of Exercise per Session: Not on file  Stress:   . Feeling of Stress : Not on file  Social Connections:   . Frequency of Communication with Friends and Family: Not on file  . Frequency of Social Gatherings with Friends and Family: Not on file  . Attends Religious Services: Not on file  . Active Member of Clubs or Organizations: Not on file  . Attends Archivist Meetings: Not on file  . Marital Status: Not on file    Allergies: No Known Allergies  Metabolic Disorder Labs: No results found for: HGBA1C, MPG No results found for: PROLACTIN Lab Results  Component Value Date   CHOL 140 09/10/2019   TRIG 36 09/10/2019   HDL 45 09/10/2019   CHOLHDL 3.1 09/10/2019   LDLCALC 85 09/10/2019   Lab Results  Component Value Date   TSH 0.52 12/08/2017    Therapeutic Level Labs: No results found for: LITHIUM No results found for: VALPROATE No components found for:  CBMZ  Current Medications: Current Outpatient Medications  Medication Sig Dispense Refill  . hydrALAZINE (APRESOLINE) 10 MG tablet Take 10 mg by mouth 3 (three) times daily.    Marland Kitchen aspirin 81 MG  tablet Take 81 mg by mouth daily.    Marland Kitchen azelastine (ASTELIN) 0.1 % nasal spray Place 1 spray into both nostrils 2 (two) times daily. Use in each nostril as directed    . benztropine (COGENTIN) 0.5 MG tablet Take 1 tablet (0.5 mg total) by mouth 2 (two) times daily. 60 tablet 3  . cetirizine (ZYRTEC) 10 MG tablet Take 10 mg by mouth daily.    . citalopram (CELEXA) 20 MG tablet Take 1 tablet (20 mg total) by mouth daily. 30 tablet 3  . clonazePAM (KLONOPIN) 2 MG tablet Take 1 tablet (2 mg total) by mouth at bedtime. 30 tablet 3  . donepezil (ARICEPT) 10 MG tablet Take 1 tablet (10 mg total) by mouth at bedtime. 30 tablet 3  . fluticasone (FLONASE) 50 MCG/ACT nasal spray  Place into both nostrils daily.    Marland Kitchen lisinopril (PRINIVIL,ZESTRIL) 10 MG tablet Take 10 mg by mouth daily.    Marland Kitchen loratadine (CLARITIN) 10 MG tablet Take 10 mg by mouth daily.    . meloxicam (MOBIC) 15 MG tablet Take 15 mg by mouth daily.    . metFORMIN (GLUCOPHAGE) 500 MG tablet Take 500 mg by mouth 2 (two) times daily with a meal.     . pantoprazole (PROTONIX) 40 MG tablet Take 1 tablet (40 mg total) by mouth daily. 30 tablet 11  . pravastatin (PRAVACHOL) 10 MG tablet Take 10 mg by mouth daily.    . risperiDONE (RISPERDAL) 3 MG tablet Take 1 tablet (3 mg total) by mouth 2 (two) times daily. 30 tablet 3  . risperidone (RISPERDAL) 4 MG tablet 3 mg in the morning, 4 mg at night (take along with 3 mg tab) 30 tablet 1  . traZODone (DESYREL) 100 MG tablet Take 1 tablet (100 mg total) by mouth at bedtime. 30 tablet 3   No current facility-administered medications for this visit.     Musculoskeletal: Strength & Muscle Tone: N/A Gait & Station: N/A Patient leans: N/A  Psychiatric Specialty Exam: Review of Systems  Psychiatric/Behavioral: Negative for agitation, behavioral problems, confusion, decreased concentration, dysphoric mood, hallucinations, self-injury, sleep disturbance and suicidal ideas. The patient is not nervous/anxious  and is not hyperactive.   All other systems reviewed and are negative.   There were no vitals taken for this visit.There is no height or weight on file to calculate BMI.  General Appearance: NA  Eye Contact:  NA  Speech:  Garbled  Volume:  Normal  Mood:  good  Affect:  NA  Thought Process:  Coherent  Orientation:  Full (Time, Place, and Person)  Thought Content: Logical   Suicidal Thoughts:  No  Homicidal Thoughts:  No  Memory:  Immediate;   Good  Judgement:  Fair  Insight:  Shallow  Psychomotor Activity:  Normal  Concentration:  Concentration: Good and Attention Span: Good  Recall:  Good  Fund of Knowledge: Good  Language: Good  Akathisia:  No  Handed:  Right  AIMS (if indicated): not done  Assets:  Communication Skills Desire for Improvement  ADL's:  Intact  Cognition: WNL  Sleep:  Good   Screenings:   Assessment and Plan:  DEIONTE SPIVACK is a 59 y.o. year old male with a history of schizophrenia, tardive dyskinesia,hyperlipidemia, diabetes, who presents for follow up appointment for below.   1. Schizophrenia, unspecified type ([place]) There has been no significant behavior issues since the last visit.  Will taper down Risperdal to avoid polypharmacy.  A caregiver is advised to contact the office if any worsening in his symptoms.  Discussed potential metabolic side effect and EPS.  Will continue citalopram to target depression and anxiety.  Will continue clonazepam as needed for anxiety.  Will continue trazodone as needed for insomnia.   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  Plan I have reviewed and updated plans as below 1.Decrease risperidone 3 mg twice a day 2. Continue citalopram 20 mg daily 3. Continue donepezil 10 mg daily 4. Continue clonazepam 2 mg qhs 5. Continue Trazodone 100 mg at night 6.Next appointment:2/23 at 10 AM for 20 mins,  phoneFax: [phone] Reviewedlabs- checked June 2021. BMP, lipid panels wnl - obtain labs at the next visit   The patient demonstrates the following risk  factors for suicide: Chronic risk factors for suicide include:psychiatric disorder ofschizophrenia. Acute risk factorsfor suicide include: unemployment. Protective factorsfor this patient include: positive social support. Considering these factors, the overall suicide risk at this point appears to below. Patientisappropriate for outpatient follow up.  Austin Clay, MD 02/23/2020, 12:18 PM

## 2020-02-23 ENCOUNTER — Other Ambulatory Visit: Payer: Self-pay | Admitting: Psychiatry

## 2020-02-23 ENCOUNTER — Encounter: Payer: Self-pay | Admitting: Psychiatry

## 2020-02-23 ENCOUNTER — Telehealth: Payer: Self-pay

## 2020-02-23 ENCOUNTER — Telehealth (HOSPITAL_COMMUNITY): Payer: Medicare Other | Admitting: Psychiatry

## 2020-02-23 ENCOUNTER — Telehealth (INDEPENDENT_AMBULATORY_CARE_PROVIDER_SITE_OTHER): Payer: Medicare Other | Admitting: Psychiatry

## 2020-02-23 ENCOUNTER — Other Ambulatory Visit: Payer: Self-pay

## 2020-02-23 ENCOUNTER — Telehealth: Payer: Self-pay | Admitting: Psychiatry

## 2020-02-23 DIAGNOSIS — F209 Schizophrenia, unspecified: Secondary | ICD-10-CM | POA: Diagnosis not present

## 2020-02-23 MED ORDER — RISPERIDONE 3 MG PO TABS
3.0000 mg | ORAL_TABLET | Freq: Two times a day (BID) | ORAL | 3 refills | Status: DC
Start: 2020-02-23 — End: 2020-05-24

## 2020-02-23 MED ORDER — TRAZODONE HCL 100 MG PO TABS
100.0000 mg | ORAL_TABLET | Freq: Every day | ORAL | 3 refills | Status: DC
Start: 2020-02-23 — End: 2020-05-24

## 2020-02-23 MED ORDER — CITALOPRAM HYDROBROMIDE 20 MG PO TABS
20.0000 mg | ORAL_TABLET | Freq: Every day | ORAL | 3 refills | Status: DC
Start: 2020-02-23 — End: 2020-05-24

## 2020-02-23 MED ORDER — DONEPEZIL HCL 10 MG PO TABS
10.0000 mg | ORAL_TABLET | Freq: Every day | ORAL | 3 refills | Status: DC
Start: 2020-02-23 — End: 2020-05-24

## 2020-02-23 MED ORDER — CLONAZEPAM 2 MG PO TABS
2.0000 mg | ORAL_TABLET | Freq: Every day | ORAL | 3 refills | Status: DC
Start: 2020-02-23 — End: 2020-05-24

## 2020-02-23 MED ORDER — RISPERIDONE 3 MG PO TABS
3.0000 mg | ORAL_TABLET | Freq: Two times a day (BID) | ORAL | 3 refills | Status: DC
Start: 2020-02-23 — End: 2020-02-23

## 2020-02-23 MED ORDER — BENZTROPINE MESYLATE 0.5 MG PO TABS
0.5000 mg | ORAL_TABLET | Freq: Two times a day (BID) | ORAL | 3 refills | Status: DC
Start: 2020-02-23 — End: 2020-05-24

## 2020-02-23 NOTE — Telephone Encounter (Signed)
Medication management - Telephone call with Mitzi Hansen, pharmacist at [facility] to verify they had received Dr.  Ivor Reining new order for Risperdal 3 mg, 1 twice a day, #60 and pharmacist verified this was received and no more questions about dosage or instructions.

## 2020-02-23 NOTE — Patient Instructions (Signed)
1.Decrease risperidone 3 mg twice a day 2. Continue citalopram 20 mg daily 3. Continue donepezil 10 mg daily 4. Continue clonazepam 2 mg qhs 5. Continue Trazodone 100 mg at night 6.Next appointment:2/23 at 10 AM

## 2020-02-23 NOTE — Telephone Encounter (Signed)
Sorry, correct order of #60 was sent.

## 2020-02-23 NOTE — Telephone Encounter (Signed)
Medication management - Telephone call from Mitzi Hansen, pharmacist at [facility] to verify new Risperdal order for 3 mg 2 times a day but only sent in #30.  Pharmacist questions if should be for #60 as per chart review patient appears to be decreasing from one 3 mg a day and a second 4 mg a day down to 3 mg twice a day.  Newly corrected order requested or call back with verification of order.

## 2020-02-23 NOTE — Telephone Encounter (Signed)
Could you contact [employer], and update with them that risperidone was decreased to 3 mg twice a day? Thanks.

## 2020-04-28 NOTE — Progress Notes (Deleted)
CARDIOLOGY CONSULT NOTE       Patient ID: Austin Mcdonald MRN: [id number] DOB/AGE: January 25, 1961 60 y.o.  Admit date: (Not on file) Referring Physician: Thorp NP Primary Physician: [facility] Primary Cardiologist: New Reason for Consultation: HTN/ ***    HPI:  41 y.o. referred by [facility] *** for HTN.  History of Schizophrenia, Depression , DM and GERD Currently on Hydralazine only 10 mg qid and Lisinopril 60 mg daily for BP Also on statin for HLD He currently lives in a group  Home Noted cognitive defect aside from schizophrenia and aricept added  Noted BP at office visit 04/28/20 139/76 mmhg She noted ulcer on right knee and recommended Mupirocin cream bid and Cephalexin 500 q6 10 days   ***  ROS All other systems reviewed and negative except as noted above  Past Medical History:  Diagnosis Date  . Depression   . Diabetes ([place])   . GERD (gastroesophageal reflux disease) 05/09/2016  . Schizophrenic disorder ([place])     No family history on file.  Social History   Socioeconomic History  . Marital status: Single    Spouse name: Not on file  . Number of children: Not on file  . Years of education: Not on file  . Highest education level: Not on file  Occupational History  . Not on file  Tobacco Use  . Smoking status: Current Every Day Smoker    Packs/day: 0.50    Types: Cigarettes  . Smokeless tobacco: Never Used  . Tobacco comment: 10 cigarettes a day  Substance and Sexual Activity  . Alcohol use: No    Alcohol/week: 0.0 standard drinks  . Drug use: No  . Sexual activity: Not on file  Other Topics Concern  . Not on file  Social History Narrative  . Not on file   Social Determinants of Health   Financial Resource Strain: Not on file  Food Insecurity: Not on file  Transportation Needs: Not on file  Physical Activity: Not on file  Stress: Not on file  Social Connections: Not on file  Intimate  Partner Violence: Not on file    Past Surgical History:  Procedure Laterality Date  . APPENDECTOMY    . COLONOSCOPY N/A 02/23/2015   Procedure: COLONOSCOPY;  Surgeon: Rogene Houston, MD;  [facility];  Service: Endoscopy;  Laterality: N/A;  . ESOPHAGEAL DILATION N/A 02/23/2015   Procedure: ESOPHAGEAL DILATION;  Surgeon: Rogene Houston, MD;  [facility];  Service: Endoscopy;  Laterality: N/A;  . ESOPHAGOGASTRODUODENOSCOPY N/A 02/23/2015   Procedure: ESOPHAGOGASTRODUODENOSCOPY (EGD);  Surgeon: Rogene Houston, MD;  [facility];  Service: Endoscopy;  Laterality: N/A;  2:00      Current Outpatient Medications:  .  aspirin 81 MG tablet, Take 81 mg by mouth daily., Disp: , Rfl:  .  azelastine (ASTELIN) 0.1 % nasal spray, Place 1 spray into both nostrils 2 (two) times daily. Use in each nostril as directed, Disp: , Rfl:  .  benztropine (COGENTIN) 0.5 MG tablet, Take 1 tablet (0.5 mg total) by mouth 2 (two) times daily., Disp: 60 tablet, Rfl: 3 .  cetirizine (ZYRTEC) 10 MG tablet, Take 10 mg by mouth daily., Disp: , Rfl:  .  citalopram (CELEXA) 20 MG tablet, Take 1 tablet (20 mg total) by mouth daily., Disp: 30 tablet, Rfl: 3 .  clonazePAM (KLONOPIN) 2 MG tablet, Take 1 tablet (2 mg total) by mouth  at bedtime., Disp: 30 tablet, Rfl: 3 .  donepezil (ARICEPT) 10 MG tablet, Take 1 tablet (10 mg total) by mouth at bedtime., Disp: 30 tablet, Rfl: 3 .  fluticasone (FLONASE) 50 MCG/ACT nasal spray, Place into both nostrils daily., Disp: , Rfl:  .  hydrALAZINE (APRESOLINE) 10 MG tablet, Take 10 mg by mouth 3 (three) times daily., Disp: , Rfl:  .  lisinopril (PRINIVIL,ZESTRIL) 10 MG tablet, Take 10 mg by mouth daily., Disp: , Rfl:  .  loratadine (CLARITIN) 10 MG tablet, Take 10 mg by mouth daily., Disp: , Rfl:  .  meloxicam (MOBIC) 15 MG tablet, Take 15 mg by mouth daily., Disp: , Rfl:  .  metFORMIN (GLUCOPHAGE) 500 MG tablet, Take 500 mg by mouth 2 (two) times daily with  a meal. , Disp: , Rfl:  .  pantoprazole (PROTONIX) 40 MG tablet, Take 1 tablet (40 mg total) by mouth daily., Disp: 30 tablet, Rfl: 11 .  pravastatin (PRAVACHOL) 10 MG tablet, Take 10 mg by mouth daily., Disp: , Rfl:  .  risperiDONE (RISPERDAL) 3 MG tablet, Take 1 tablet (3 mg total) by mouth 2 (two) times daily., Disp: 60 tablet, Rfl: 3 .  risperidone (RISPERDAL) 4 MG tablet, 3 mg in the morning, 4 mg at night (take along with 3 mg tab), Disp: 30 tablet, Rfl: 1 .  traZODone (DESYREL) 100 MG tablet, Take 1 tablet (100 mg total) by mouth at bedtime., Disp: 30 tablet, Rfl: 3    Physical Exam: There were no vitals taken for this visit.   Affect appropriate Healthy:  appears stated age 68: normal Neck supple with no adenopathy JVP normal no bruits no thyromegaly Lungs clear with no wheezing and good diaphragmatic motion Heart:  S1/S2 no murmur, no rub, gallop or click PMI normal Abdomen: benighn, BS positve, no tenderness, no AAA no bruit.  No HSM or HJR Distal pulses intact with no bruits No edema Neuro non-focal Skin warm and dry No muscular weakness   Labs:  No results found for: WBC, HGB, HCT, MCV, PLT No results for input(s): NA, K, CL, CO2, BUN, CREATININE, CALCIUM, PROT, BILITOT, ALKPHOS, ALT, AST, GLUCOSE in the last 168 hours.  Invalid input(s): LABALBU No results found for: CKTOTAL, CKMB, CKMBINDEX, TROPONINI  Lab Results  Component Value Date   CHOL 140 09/10/2019   Lab Results  Component Value Date   HDL 45 09/10/2019   Lab Results  Component Value Date   LDLCALC 85 09/10/2019   Lab Results  Component Value Date   TRIG 36 09/10/2019   Lab Results  Component Value Date   CHOLHDL 3.1 09/10/2019   No results found for: LDLDIRECT    Radiology: No results found.  EKG: ***   ASSESSMENT AND PLAN:   1. HTN:  *** 2. HLD continue statin labs with primary  3. Schizophrenia:  Seeing behavioral health on Cogentin , celexa, risperdal, trazodone and  klonopin 4. DM  Discussed low carb diet.  Target hemoglobin A1c is 6.5 or less.  Continue current medications.  *** ***  Signed: Jenkins Rouge 04/28/2020, 1:12 PM

## 2020-05-08 ENCOUNTER — Ambulatory Visit: Payer: Medicare Other | Admitting: Cardiovascular Disease

## 2020-05-11 NOTE — Progress Notes (Deleted)
BH MD/PA/NP OP Progress Note  05/11/2020 10:52 AM Austin Mcdonald  MRN:  [id number]  Chief Complaint:  HPI: . Visit Diagnosis: No diagnosis found.  Past Psychiatric History: Please see initial evaluation for full details. I have reviewed the history. No updates at this time.     Past Medical History:  Past Medical History:  Diagnosis Date  . Depression   . Diabetes ([place])   . GERD (gastroesophageal reflux disease) 05/09/2016  . Schizophrenic disorder [facility])     Past Surgical History:  Procedure Laterality Date  . APPENDECTOMY    . COLONOSCOPY N/A 02/23/2015   Procedure: COLONOSCOPY;  Surgeon: Rogene Houston, MD;  [facility];  Service: Endoscopy;  Laterality: N/A;  . ESOPHAGEAL DILATION N/A 02/23/2015   Procedure: ESOPHAGEAL DILATION;  Surgeon: Rogene Houston, MD;  [facility];  Service: Endoscopy;  Laterality: N/A;  . ESOPHAGOGASTRODUODENOSCOPY N/A 02/23/2015   Procedure: ESOPHAGOGASTRODUODENOSCOPY (EGD);  Surgeon: Rogene Houston, MD;  [facility];  Service: Endoscopy;  Laterality: N/A;  2:00    Family Psychiatric History: Please see initial evaluation for full details. I have reviewed the history. No updates at this time.     Family History: No family history on file.  Social History:  Social History   Socioeconomic History  . Marital status: Single    Spouse name: Not on file  . Number of children: Not on file  . Years of education: Not on file  . Highest education level: Not on file  Occupational History  . Not on file  Tobacco Use  . Smoking status: Current Every Day Smoker    Packs/day: 0.50    Types: Cigarettes  . Smokeless tobacco: Never Used  . Tobacco comment: 10 cigarettes a day  Substance and Sexual Activity  . Alcohol use: No    Alcohol/week: 0.0 standard drinks  . Drug use: No  . Sexual activity: Not on file  Other Topics Concern  . Not on file  Social History Narrative  . Not on file   Social Determinants  of Health   Financial Resource Strain: Not on file  Food Insecurity: Not on file  Transportation Needs: Not on file  Physical Activity: Not on file  Stress: Not on file  Social Connections: Not on file    Allergies: No Known Allergies  Metabolic Disorder Labs: No results found for: HGBA1C, MPG No results found for: PROLACTIN Lab Results  Component Value Date   CHOL 140 09/10/2019   TRIG 36 09/10/2019   HDL 45 09/10/2019   CHOLHDL 3.1 09/10/2019   [place] 85 09/10/2019   Lab Results  Component Value Date   TSH 0.52 12/08/2017    Therapeutic Level Labs: No results found for: LITHIUM No results found for: VALPROATE No components found for:  CBMZ  Current Medications: Current Outpatient Medications  Medication Sig Dispense Refill  . aspirin 81 MG tablet Take 81 mg by mouth daily.    Marland Kitchen azelastine (ASTELIN) 0.1 % nasal spray Place 1 spray into both nostrils 2 (two) times daily. Use in each nostril as directed    . benztropine (COGENTIN) 0.5 MG tablet Take 1 tablet (0.5 mg total) by mouth 2 (two) times daily. 60 tablet 3  . cetirizine (ZYRTEC) 10 MG tablet Take 10 mg by mouth daily.    . citalopram (CELEXA) 20 MG tablet Take 1 tablet (20 mg total) by mouth daily. 30 tablet 3  . clonazePAM (KLONOPIN) 2 MG tablet Take  1 tablet (2 mg total) by mouth at bedtime. 30 tablet 3  . donepezil (ARICEPT) 10 MG tablet Take 1 tablet (10 mg total) by mouth at bedtime. 30 tablet 3  . fluticasone (FLONASE) 50 MCG/ACT nasal spray Place into both nostrils daily.    . hydrALAZINE (APRESOLINE) 10 MG tablet Take 10 mg by mouth 3 (three) times daily.    Marland Kitchen lisinopril (PRINIVIL,ZESTRIL) 10 MG tablet Take 10 mg by mouth daily.    Marland Kitchen loratadine (CLARITIN) 10 MG tablet Take 10 mg by mouth daily.    . meloxicam (MOBIC) 15 MG tablet Take 15 mg by mouth daily.    . metFORMIN (GLUCOPHAGE) 500 MG tablet Take 500 mg by mouth 2 (two) times daily with a meal.     . pantoprazole (PROTONIX) 40 MG tablet Take 1  tablet (40 mg total) by mouth daily. 30 tablet 11  . pravastatin (PRAVACHOL) 10 MG tablet Take 10 mg by mouth daily.    . risperiDONE (RISPERDAL) 3 MG tablet Take 1 tablet (3 mg total) by mouth 2 (two) times daily. 60 tablet 3  . risperidone (RISPERDAL) 4 MG tablet 3 mg in the morning, 4 mg at night (take along with 3 mg tab) 30 tablet 1  . traZODone (DESYREL) 100 MG tablet Take 1 tablet (100 mg total) by mouth at bedtime. 30 tablet 3   No current facility-administered medications for this visit.     Musculoskeletal: Strength & Muscle Tone: N/A Gait & Station: N/A Patient leans: N/A  Psychiatric Specialty Exam: Review of Systems  There were no vitals taken for this visit.There is no height or weight on file to calculate BMI.  General Appearance: {Appearance:22683}  Eye Contact:  {BHH EYE CONTACT:22684}  Speech:  Clear and Coherent  Volume:  Normal  Mood:  {BHH MOOD:22306}  Affect:  {Affect (PAA):22687}  Thought Process:  Coherent  Orientation:  Full (Time, Place, and Person)  Thought Content: Logical   Suicidal Thoughts:  {ST/HT (PAA):22692}  Homicidal Thoughts:  {ST/HT (PAA):22692}  Memory:  Immediate;   Good  Judgement:  {Judgement (PAA):22694}  Insight:  {Insight (PAA):22695}  Psychomotor Activity:  Normal  Concentration:  Concentration: Good and Attention Span: Good  Recall:  Good  Fund of Knowledge: Good  Language: Good  Akathisia:  No  Handed:  Right  AIMS (if indicated): not done  Assets:  Communication Skills Desire for Improvement  ADL's:  Intact  Cognition: WNL  Sleep:  {BHH GOOD/FAIR/POOR:22877}   Screenings:   Assessment and Plan:  Austin Mcdonald is a 59 y.o. year old male with a history of schizophrenia, tardive dyskinesia,hyperlipidemia, diabetes, who presents for follow up appointment for below.    1. Schizophrenia, unspecified type ([place]) There has been no significant behavior issues since the last visit.  Will taper down Risperdal to avoid  polypharmacy.  A caregiver is advised to contact the office if any worsening in his symptoms.  Discussed potential metabolic side effect and EPS.  Will continue citalopram to target depression and anxiety.  Will continue clonazepam as needed for anxiety.  Will continue trazodone as needed for insomnia.   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  Plan  1.Decrease risperidone 3 mg twice a day 2. Continue citalopram 20 mg daily 3. Continue donepezil 10 mg daily 4. Continue clonazepam 2 mg qhs 5. Continue Trazodone 100 mg at night 6.Next appointment:2/23 at 10 AM for20 mins, phoneFax:  [phone] Reviewedlabs-checked June 2021. BMP, lipid panels wnl - obtain labs at the next visit   The patient demonstrates the following risk factors for suicide: Chronic risk factors for suicide include:psychiatric disorder ofschizophrenia. Acute risk factorsfor suicide include: unemployment. Protective factorsfor this patient include: positive social support. Considering these factors, the overall suicide risk at this point appears to below. Patientisappropriate for outpatient follow up.   [name], MD 05/11/2020, 10:52 AM

## 2020-05-17 ENCOUNTER — Telehealth: Payer: Self-pay | Admitting: Psychiatry

## 2020-05-17 ENCOUNTER — Other Ambulatory Visit: Payer: Self-pay

## 2020-05-17 ENCOUNTER — Telehealth: Payer: Medicare Other | Admitting: Psychiatry

## 2020-05-22 NOTE — Progress Notes (Signed)
Virtual Visit via Telephone Note  I connected with Austin Mcdonald on 05/24/20 at  9:00 AM EST by telephone and verified that I am speaking with the correct person using two identifiers.  Location: Patient: group home Provider: office Persons participated in the visit- patient, provider, Ms. Physicist, medical at the facility   I discussed the limitations, risks, security and privacy concerns of performing an evaluation and management service by telephone and the availability of in person appointments. I also discussed with the patient that there may be a patient responsible charge related to this service. The patient expressed understanding and agreed to proceed.    I discussed the assessment and treatment plan with the patient. The patient was provided an opportunity to ask questions and all were answered. The patient agreed with the plan and demonstrated an understanding of the instructions.   The patient was advised to call back or seek an in-person evaluation if the symptoms worsen or if the condition fails to improve as anticipated.  I provided 13 minutes of non-face-to-face time during this encounter.   Austin Clay, MD    [facility] MD/PA/NP OP Progress Note  05/24/2020 9:28 AM [name]  MRN:  [id number]  Chief Complaint:  Chief Complaint    Follow-up; Schizophrenia     HPI:  This is a follow-up appointment for schizophrenia.  He states that he has been doing good.  Although he did not meet his family member on Christmas, he went home for Thanksgiving.  He helped his brother in the yard.  He denies any concern at the group home, and states that it is safe anywhere he goes.  He sleeps well.  He has good appetite.  He denies feeling depressed.  He denies SI, HI.  He denies AH, VH, or paranoia.    Ms. [name] presents to the interview.  She denies any change since the last visit.  No behavior issues.  Ms. [name] questions that Cogentin was ordered at the last visit, and he  restarted this medication.  She agrees to try discontinuing this medication.    Austin Mcdonald independent in the following: feeding, continence, grooming and toileting, walking, hygiene Require assistance:bathing(to prevent fall)  192 lbs Wt Readings from Last 3 Encounters:  08/26/19 198 lb 1.6 oz (89.9 kg)  05/25/18 198 lb (89.8 kg)  05/13/18 200 lb 4.8 oz (90.9 kg)    Visit Diagnosis:    ICD-10-CM   1. Schizophrenia, unspecified type ([place])  F20.9     Past Psychiatric History: Please see initial evaluation for full details. I have reviewed the history. No updates at this time.     Past Medical History:  Past Medical History:  Diagnosis Date  . Depression   . Diabetes ([place])   . GERD (gastroesophageal reflux disease) 05/09/2016  . Schizophrenic disorder [facility])     Past Surgical History:  Procedure Laterality Date  . APPENDECTOMY    . COLONOSCOPY N/A 02/23/2015   Procedure: COLONOSCOPY;  Surgeon: Rogene Houston, MD;  [facility];  Service: Endoscopy;  Laterality: N/A;  . ESOPHAGEAL DILATION N/A 02/23/2015   Procedure: ESOPHAGEAL DILATION;  Surgeon: Rogene Houston, MD;  [facility];  Service: Endoscopy;  Laterality: N/A;  . ESOPHAGOGASTRODUODENOSCOPY N/A 02/23/2015   Procedure: ESOPHAGOGASTRODUODENOSCOPY (EGD);  Surgeon: Rogene Houston, MD;  [facility];  Service: Endoscopy;  Laterality: N/A;  2:00    Family Psychiatric History: Please see initial evaluation for full details. I have reviewed  the history. No updates at this time.     Family History: No family history on file.  Social History:  Social History   Socioeconomic History  . Marital status: Single    Spouse name: Not on file  . Number of children: Not on file  . Years of education: Not on file  . Highest education level: Not on file  Occupational History  . Not on file  Tobacco Use  . Smoking status: Current Every Day Smoker    Packs/day: 0.50    Types:  Cigarettes  . Smokeless tobacco: Never Used  . Tobacco comment: 10 cigarettes a day  Substance and Sexual Activity  . Alcohol use: No    Alcohol/week: 0.0 standard drinks  . Drug use: No  . Sexual activity: Not on file  Other Topics Concern  . Not on file  Social History Narrative  . Not on file   Social Determinants of Health   Financial Resource Strain: Not on file  Food Insecurity: Not on file  Transportation Needs: Not on file  Physical Activity: Not on file  Stress: Not on file  Social Connections: Not on file    Allergies: No Known Allergies  Metabolic Disorder Labs: No results found for: HGBA1C, MPG No results found for: PROLACTIN Lab Results  Component Value Date   CHOL 140 09/10/2019   TRIG 36 09/10/2019   HDL 45 09/10/2019   CHOLHDL 3.1 09/10/2019   [place] 85 09/10/2019   Lab Results  Component Value Date   TSH 0.52 12/08/2017    Therapeutic Level Labs: No results found for: LITHIUM No results found for: VALPROATE No components found for:  CBMZ  Current Medications: Current Outpatient Medications  Medication Sig Dispense Refill  . aspirin 81 MG tablet Take 81 mg by mouth daily.    Marland Kitchen azelastine (ASTELIN) 0.1 % nasal spray Place 1 spray into both nostrils 2 (two) times daily. Use in each nostril as directed    . cetirizine (ZYRTEC) 10 MG tablet Take 10 mg by mouth daily.    Derrill Memo ON 06/21/2020] citalopram (CELEXA) 20 MG tablet Take 1 tablet (20 mg total) by mouth daily. 30 tablet 2  . [START ON 06/21/2020] clonazePAM (KLONOPIN) 2 MG tablet Take 1 tablet (2 mg total) by mouth at bedtime. 30 tablet 2  . [START ON 06/21/2020] donepezil (ARICEPT) 10 MG tablet Take 1 tablet (10 mg total) by mouth at bedtime. 30 tablet 2  . fluticasone (FLONASE) 50 MCG/ACT nasal spray Place into both nostrils daily.    . hydrALAZINE (APRESOLINE) 10 MG tablet Take 10 mg by mouth 3 (three) times daily.    Marland Kitchen lisinopril (PRINIVIL,ZESTRIL) 10 MG tablet Take 10 mg by mouth daily.     Marland Kitchen loratadine (CLARITIN) 10 MG tablet Take 10 mg by mouth daily.    . meloxicam (MOBIC) 15 MG tablet Take 15 mg by mouth daily.    . metFORMIN (GLUCOPHAGE) 500 MG tablet Take 500 mg by mouth 2 (two) times daily with a meal.     . pantoprazole (PROTONIX) 40 MG tablet Take 1 tablet (40 mg total) by mouth daily. 30 tablet 11  . pravastatin (PRAVACHOL) 10 MG tablet Take 10 mg by mouth daily.    Derrill Memo ON 06/21/2020] risperiDONE (RISPERDAL) 3 MG tablet Take 1 tablet (3 mg total) by mouth 2 (two) times daily. 60 tablet 2  . [START ON 06/21/2020] traZODone (DESYREL) 100 MG tablet Take 1 tablet (100 mg total) by mouth at  bedtime. 30 tablet 2   No current facility-administered medications for this visit.     Musculoskeletal: Strength & Muscle Tone: N/A Gait & Station: N/A Patient leans: N/A  Psychiatric Specialty Exam: Review of Systems  Psychiatric/Behavioral: Negative for agitation, behavioral problems, confusion, decreased concentration, dysphoric mood, hallucinations, self-injury, sleep disturbance and suicidal ideas. The patient is not nervous/anxious and is not hyperactive.   All other systems reviewed and are negative.   There were no vitals taken for this visit.There is no height or weight on file to calculate BMI.  General Appearance: NA  Eye Contact:  NA  Speech:  Garbled  Volume:  Normal  Mood:  good  Affect:  NA  Thought Process:  Coherent  Orientation:  Full (Time, Place, and Person)  Thought Content: Logical   Suicidal Thoughts:  No  Homicidal Thoughts:  No  Memory:  Immediate;   Good  Judgement:  Fair  Insight:  Shallow  Psychomotor Activity:  Normal  Concentration:  Concentration: Good and Attention Span: Good  Recall:  Good  Fund of Knowledge: Good  Language: Good  Akathisia:  No  Handed:  Right  AIMS (if indicated): not done  Assets:  Communication Skills Desire for Improvement  ADL's:  Intact  Cognition: WNL  Sleep:  Good   Screenings:   Assessment  and Plan:  OTHER ATIENZA is a 60 y.o. year old male with a history of  schizophrenia, tardive dyskinesia,hyperlipidemia, diabetes, who presents for follow up appointment for below.   1. Schizophrenia, unspecified type ([place]) There has been no significant behavior issues despite tapering down Risperdal.  Discussed potential metabolic side effect and EPS.  Will continue citalopram to target depression and anxiety.  Will continue clonazepam as needed for anxiety.  Will continue trazodone as needed for insomnia.   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 We will continue donepezil for cognitive impairment.   Plan I have reviewed and updated plans as below 1.Continue risperidone 3 mg twice a day 2. Continue citalopram 20 mg daily 3. Continue donepezil 10 mg daily 4. Continue clonazepam 2 mg qhs 5. Discontinue benztropine  5. Continue Trazodone 100 mg at night 6.Next appointment:5/30 at 9:30 for 30 mins, in person Fax: [phone] Reviewedlabs-checked June 2021. BMP, lipid panels wnl - obtain labs at the next visit   The patient demonstrates the following risk factors for suicide: Chronic risk factors for suicide include:psychiatric disorder ofschizophrenia. Acute risk factorsfor suicide include: unemployment. Protective factorsfor this patient include: positive social support. Considering these factors, the overall suicide risk at this point appears to below. Patientisappropriate for outpatient follow up.   Austin Clay, MD 05/24/2020, 9:28 AM

## 2020-05-24 ENCOUNTER — Other Ambulatory Visit: Payer: Self-pay

## 2020-05-24 ENCOUNTER — Encounter: Payer: Self-pay | Admitting: Psychiatry

## 2020-05-24 ENCOUNTER — Telehealth (INDEPENDENT_AMBULATORY_CARE_PROVIDER_SITE_OTHER): Payer: Medicare Other | Admitting: Psychiatry

## 2020-05-24 DIAGNOSIS — F209 Schizophrenia, unspecified: Secondary | ICD-10-CM

## 2020-05-24 MED ORDER — TRAZODONE HCL 100 MG PO TABS
100.0000 mg | ORAL_TABLET | Freq: Every day | ORAL | 2 refills | Status: DC
Start: 2020-06-21 — End: 2020-09-07

## 2020-05-24 MED ORDER — DONEPEZIL HCL 10 MG PO TABS
10.0000 mg | ORAL_TABLET | Freq: Every day | ORAL | 2 refills | Status: DC
Start: 2020-06-21 — End: 2020-09-07

## 2020-05-24 MED ORDER — RISPERIDONE 3 MG PO TABS
3.0000 mg | ORAL_TABLET | Freq: Two times a day (BID) | ORAL | 2 refills | Status: DC
Start: 2020-06-21 — End: 2020-09-07

## 2020-05-24 MED ORDER — CLONAZEPAM 2 MG PO TABS: 2.0000 mg | ORAL_TABLET | Freq: Every day | ORAL | 2 refills | Status: DC

## 2020-05-24 MED ORDER — CITALOPRAM HYDROBROMIDE 20 MG PO TABS
20.0000 mg | ORAL_TABLET | Freq: Every day | ORAL | 2 refills | Status: DC
Start: 2020-06-21 — End: 2020-09-07

## 2020-05-24 NOTE — Patient Instructions (Signed)
1.Continue risperidone 3 mg twice a day 2.Continue citalopram 20 mg daily 3. Continue donepezil 10 mg daily 4. Continue clonazepam 2 mg qhs 5. Discontinue benztropine  5. Continue Trazodone 100 mg at night 6.Next appointment:5/30 at 9:30,  in person visit

## 2020-06-05 ENCOUNTER — Telehealth: Payer: Self-pay

## 2020-06-05 NOTE — Telephone Encounter (Signed)
mrs blackwell called states that they need to speak with you about medication and about pt behavior. she stated that they almost had to call the police because her was acting out so bad.  they did not give him the medcation that pm but they gave it too him this am.  She stated that you have made some medication changes at his last visit.

## 2020-06-05 NOTE — Telephone Encounter (Signed)
Talked with Ms. Austin Mcdonald. She states that he is doing good today. Although he flushed risperidone on Friday, he took it this morning, and has had no issues. She denies any other concern at this time.

## 2020-06-16 ENCOUNTER — Ambulatory Visit: Payer: Medicare Other | Admitting: Internal Medicine

## 2020-08-01 ENCOUNTER — Emergency Department (HOSPITAL_COMMUNITY): Payer: Medicare Other

## 2020-08-01 ENCOUNTER — Other Ambulatory Visit: Payer: Self-pay

## 2020-08-01 ENCOUNTER — Encounter (HOSPITAL_COMMUNITY): Payer: Self-pay | Admitting: Emergency Medicine

## 2020-08-01 ENCOUNTER — Emergency Department (HOSPITAL_COMMUNITY)
Admission: EM | Admit: 2020-08-01 | Discharge: 2020-08-01 | Disposition: A | Discharge status: Home / Self Care | Payer: Medicare Other | Attending: Emergency Medicine | Admitting: Emergency Medicine

## 2020-08-01 DIAGNOSIS — R569 Unspecified convulsions: Secondary | ICD-10-CM | POA: Diagnosis present

## 2020-08-01 DIAGNOSIS — E119 Type 2 diabetes mellitus without complications: Secondary | ICD-10-CM | POA: Diagnosis not present

## 2020-08-01 DIAGNOSIS — F17210 Nicotine dependence, cigarettes, uncomplicated: Secondary | ICD-10-CM | POA: Insufficient documentation

## 2020-08-01 DIAGNOSIS — R4182 Altered mental status, unspecified: Secondary | ICD-10-CM | POA: Insufficient documentation

## 2020-08-01 DIAGNOSIS — Z7982 Long term (current) use of aspirin: Secondary | ICD-10-CM | POA: Insufficient documentation

## 2020-08-01 DIAGNOSIS — Z7984 Long term (current) use of oral hypoglycemic drugs: Secondary | ICD-10-CM | POA: Insufficient documentation

## 2020-08-01 LAB — URINALYSIS, COMPLETE (UACMP) WITH MICROSCOPIC
Bacteria, UA: NONE SEEN
Bilirubin Urine: NEGATIVE
Glucose, UA: NEGATIVE mg/dL
Hgb urine dipstick: NEGATIVE
Ketones, ur: NEGATIVE mg/dL
Leukocytes,Ua: NEGATIVE
Nitrite: NEGATIVE
Protein, ur: NEGATIVE mg/dL
Specific Gravity, Urine: 1.01 (ref 1.005–1.030)
pH: 6 (ref 5.0–8.0)

## 2020-08-01 LAB — CBC WITH DIFFERENTIAL/PLATELET
Abs Immature Granulocytes: 0.02 10*3/uL (ref 0.00–0.07)
Basophils Absolute: 0 10*3/uL (ref 0.0–0.1)
Basophils Relative: 0 %
Eosinophils Absolute: 0.1 10*3/uL (ref 0.0–0.5)
Eosinophils Relative: 1 %
HCT: 35.8 % — ABNORMAL LOW (ref 39.0–52.0)
Hemoglobin: 11.5 g/dL — ABNORMAL LOW (ref 13.0–17.0)
Immature Granulocytes: 0 %
Lymphocytes Relative: 12 %
Lymphs Abs: 0.9 10*3/uL (ref 0.7–4.0)
MCH: 31 pg (ref 26.0–34.0)
MCHC: 32.1 g/dL (ref 30.0–36.0)
MCV: 96.5 fL (ref 80.0–100.0)
Monocytes Absolute: 0.6 10*3/uL (ref 0.1–1.0)
Monocytes Relative: 9 %
Neutro Abs: 5.7 10*3/uL (ref 1.7–7.7)
Neutrophils Relative %: 78 %
Platelets: 160 10*3/uL (ref 150–400)
RBC: 3.71 MIL/uL — ABNORMAL LOW (ref 4.22–5.81)
RDW: 14.2 % (ref 11.5–15.5)
WBC: 7.3 10*3/uL (ref 4.0–10.5)
nRBC: 0 % (ref 0.0–0.2)

## 2020-08-01 LAB — COMPREHENSIVE METABOLIC PANEL
ALT: 21 U/L (ref 0–44)
AST: 25 U/L (ref 15–41)
Albumin: 3.8 g/dL (ref 3.5–5.0)
Alkaline Phosphatase: 74 U/L (ref 38–126)
Anion gap: 9 (ref 5–15)
BUN: 18 mg/dL (ref 8–23)
CO2: 26 mmol/L (ref 22–32)
Calcium: 8.9 mg/dL (ref 8.9–10.3)
Chloride: 103 mmol/L (ref 98–111)
Creatinine, Ser: 0.87 mg/dL (ref 0.61–1.24)
GFR, Estimated: >60 mL/min (ref >60)
Glucose, Bld: 126 mg/dL — ABNORMAL HIGH (ref 70–99)
Potassium: 3.4 mmol/L — ABNORMAL LOW (ref 3.5–5.1)
Sodium: 138 mmol/L (ref 135–145)
Total Bilirubin: 1 mg/dL (ref 0.3–1.2)
Total Protein: 6.7 g/dL (ref 6.5–8.1)

## 2020-08-01 NOTE — ED Triage Notes (Signed)
Aregiver reports that pt when stiff on yesterday at 1730.  Pt has slurred speech but is normal according to [facility] (caregiver).  EMS was on yesterday but pt refused to go to facility.  Denies pain at this time.

## 2020-08-01 NOTE — ED Provider Notes (Signed)
[facility] EMERGENCY DEPARTMENT Provider Note   CSN: [id number] Arrival date & time: 08/01/20  7616     History Chief Complaint  Patient presents with  . Altered Mental Status    Austin Mcdonald is a 60 y.o. male.  HPI      Austin Mcdonald is a 60 y.o. male with past medical history of depression, type 2 diabetes, GERD, schizophrenia and resides at [facility], who presents to the Emergency Department accompanied by his caregiver for evaluation of altered mental status change that occurred yesterday.  Caregiver states that he was in the bathroom at approximately 5:30 PM to urinate when he suddenly became "stiff and unresponsive" episode lasted approximately 1 minute.  He was incontinent of stool at the time.  EMS was contacted and evaluated the patient but he refused to come to the hospital.  Patient has slurred speech but this is his baseline per caregiver.  Patient denies any symptoms at this time and expresses that he was straining to have a bowel movement.  No history of seizures or prior strokes.  Denies recent illness, medication changes, chest pain, abdominal pain, shortness of breath.    Past Medical History:  Diagnosis Date  . Depression   . Diabetes ([place])   . GERD (gastroesophageal reflux disease) 05/09/2016  . Schizophrenic disorder [facility])     Patient Active Problem List   Diagnosis Date Noted  . Schizophrenia ([place]) 07/22/2017  . Dysphagia 05/09/2017  . Diarrhea 05/09/2017  . GERD (gastroesophageal reflux disease) 05/09/2016  . Diabetes ([place]) 01/12/2015  . Depression 01/12/2015    Past Surgical History:  Procedure Laterality Date  . APPENDECTOMY    . COLONOSCOPY N/A 02/23/2015   Procedure: COLONOSCOPY;  Surgeon: Rogene Houston, MD;  Location[facility] ENDO [facility];  Service: Endoscopy;  Laterality: N/A;  . ESOPHAGEAL DILATION N/A 02/23/2015   Procedure: ESOPHAGEAL DILATION;  Surgeon: Rogene Houston, MD;  Location: [facility] ENDO [facility];  Service: Endoscopy;   Laterality: N/A;  . ESOPHAGOGASTRODUODENOSCOPY N/A 02/23/2015   Procedure: ESOPHAGOGASTRODUODENOSCOPY (EGD);  Surgeon: Rogene Houston, MD;  Location: [facility] ENDO SUITE;  Service: Endoscopy;  Laterality: N/A;  2:00       History reviewed. No pertinent family history.  Social History   Tobacco Use  . Smoking status: Current Every Day Smoker    Packs/day: 0.50    Types: Cigarettes  . Smokeless tobacco: Never Used  . Tobacco comment: 10 cigarettes a day  Substance Use Topics  . Alcohol use: No    Alcohol/week: 0.0 standard drinks  . Drug use: No    Home Medications Prior to Admission medications   Medication Sig Start Date End Date Taking? Authorizing Provider  aspirin 81 MG tablet Take 81 mg by mouth daily.    [provider]  azelastine (ASTELIN) 0.1 % nasal spray Place 1 spray into both nostrils 2 (two) times daily. Use in each nostril as directed    [provider]  cetirizine (ZYRTEC) 10 MG tablet Take 10 mg by mouth daily.    [provider]  citalopram (CELEXA) 20 MG tablet Take 1 tablet (20 mg total) by mouth daily. 06/21/20   [name], MD  clonazePAM (KLONOPIN) 2 MG tablet Take 1 tablet (2 mg total) by mouth at bedtime. 06/21/20   [name], MD  donepezil (ARICEPT) 10 MG tablet Take 1 tablet (10 mg total) by mouth at bedtime. 06/21/20   [name], MD  fluticasone (FLONASE) 50 MCG/ACT nasal spray  Place into both nostrils daily.    [provider]  hydrALAZINE (APRESOLINE) 10 MG tablet Take 10 mg by mouth 3 (three) times daily.    [provider]  lisinopril (PRINIVIL,ZESTRIL) 10 MG tablet Take 10 mg by mouth daily.    [provider]  lisinopril (ZESTRIL) 40 MG tablet Take 40 mg by mouth 2 (two) times daily. 07/17/20   [provider]  loratadine (CLARITIN) 10 MG tablet Take 10 mg by mouth daily.    [provider]  meloxicam (MOBIC) 15 MG tablet Take 15 mg by mouth daily.    [provider]   metFORMIN (GLUCOPHAGE) 500 MG tablet Take 500 mg by mouth 2 (two) times daily with a meal.     [provider]  pantoprazole (PROTONIX) 40 MG tablet Take 1 tablet (40 mg total) by mouth daily. 08/26/19   Laurine Blazer B, PA-C  pravastatin (PRAVACHOL) 10 MG tablet Take 10 mg by mouth daily.    [provider]  risperiDONE (RISPERDAL) 3 MG tablet Take 1 tablet (3 mg total) by mouth 2 (two) times daily. 06/21/20   [name], MD  traZODone (DESYREL) 100 MG tablet Take 1 tablet (100 mg total) by mouth at bedtime. 06/21/20   [name], MD    Allergies    Patient has no known allergies.  Review of Systems   Review of Systems  Constitutional: Negative for chills, fatigue and fever.  HENT: Negative for trouble swallowing.   Eyes: Negative for visual disturbance.  Respiratory: Negative for cough and shortness of breath.   Cardiovascular: Negative for chest pain.  Gastrointestinal: Negative for abdominal pain, nausea and vomiting.  Genitourinary: Negative for dysuria, flank pain and hematuria.  Musculoskeletal: Negative for myalgias, neck pain and neck stiffness.  Skin: Negative for rash.  Neurological: Positive for syncope. Negative for dizziness, seizures, facial asymmetry, weakness, numbness and headaches.  Hematological: Does not bruise/bleed easily.    Physical Exam Updated Vital Signs BP (!) 172/75   Pulse 89   Resp (!) 25   Ht 6\' 1"  (1.854 m)   Wt 90 kg   SpO2 97%   BMI 26.18 kg/m   Physical Exam Vitals and nursing note reviewed.  Constitutional:      General: He is not in acute distress.    Appearance: He is not ill-appearing or toxic-appearing.  HENT:     Head: Atraumatic.     Mouth/Throat:     Mouth: Mucous membranes are moist.  Eyes:     Extraocular Movements: Extraocular movements intact.     Conjunctiva/sclera: Conjunctivae normal.     Pupils: Pupils are equal, round, and reactive to light.  Cardiovascular:     Rate and Rhythm: Normal rate  and regular rhythm.     Pulses: Normal pulses.  Pulmonary:     Effort: Pulmonary effort is normal. No respiratory distress.  Chest:     Chest wall: No tenderness.  Musculoskeletal:     Cervical back: Normal range of motion. No tenderness.     Right lower leg: No edema.     Left lower leg: No edema.  Skin:    General: Skin is warm.     Capillary Refill: Capillary refill takes less than 2 seconds.     Findings: No rash.  Neurological:     Mental Status: He is alert.     GCS: GCS eye subscore is 4. GCS verbal subscore is 5. GCS motor subscore is 6.  Sensory: Sensation is intact.     Motor: Motor function is intact.     Coordination: Coordination is intact.     Comments: CN II-XII intact.  Speech slurred, baseline per the caregiver.  Follows commands well.  Grip strength symmetrical.  No facial droop or pronator drift.       ED Results / Procedures / Treatments   Labs (all labs ordered are listed, but only abnormal results are displayed) Labs Reviewed  COMPREHENSIVE METABOLIC PANEL - Abnormal; Notable for the following components:      Result Value   Potassium 3.4 (*)    Glucose, Bld 126 (*)    All other components within normal limits  CBC WITH DIFFERENTIAL/PLATELET - Abnormal; Notable for the following components:   RBC 3.71 (*)    Hemoglobin 11.5 (*)    HCT 35.8 (*)    All other components within normal limits  URINALYSIS, COMPLETE (UACMP) WITH MICROSCOPIC    EKG EKG Interpretation  Date/Time:  Tuesday Aug 01 2020 09:30:02 EDT Ventricular Rate:  93 PR Interval:  157 QRS Duration: 83 QT Interval:  401 QTC Calculation: 499 R Axis:   71 Text Interpretation: Sinus rhythm Borderline prolonged QT interval Baseline wander in lead(s) V5 No old tracing to compare Confirmed by Isla Pence [phone]) on 08/01/2020 10:12:37 AM   Radiology DG Chest 2 View  Result Date: 08/01/2020 CLINICAL DATA:  Cough EXAM: CHEST - 2 VIEW COMPARISON:  None. FINDINGS: Lungs are clear.  Heart size and pulmonary vascularity are normal. No adenopathy. No bone lesions. IMPRESSION: Lungs clear.  Heart size normal. Electronically Signed   By: Lowella Grip III M.D.   On: 08/01/2020 11:08   CT HEAD WO CONTRAST  Result Date: 08/01/2020 CLINICAL DATA:  60 year old male with altered mental status. EXAM: CT HEAD WITHOUT CONTRAST TECHNIQUE: Contiguous axial images were obtained from the base of the skull through the vertex without intravenous contrast. COMPARISON:  None. FINDINGS: Brain: No midline shift, ventriculomegaly, mass effect, evidence of mass lesion, intracranial hemorrhage or evidence of cortically based acute infarction. Gray-white matter differentiation is within normal limits throughout the brain. Small cavum septum pellucidum, normal variant. Mega cisterna magna (normal variant) suspected. No cortical encephalomalacia identified. Vascular: Mild Calcified atherosclerosis at the skull base. No suspicious intracranial vascular hyperdensity. Negative Skull: Negative. Sinuses/Orbits: Visualized paranasal sinuses and mastoids are clear. Other: Visualized orbits and scalp soft tissues are within normal limits. IMPRESSION: Negative for age noncontrast CT appearance of the brain. Electronically Signed   By: Genevie Ann M.D.   On: 08/01/2020 10:31    Procedures Procedures   Medications Ordered in ED Medications - No data to display  ED Course  I have reviewed the triage vital signs and the nursing notes.  Pertinent labs & imaging results that were available during my care of the patient were reviewed by me and considered in my medical decision making (see chart for details).    MDM Rules/Calculators/A&P                         Patient here from group home, accompanied by his caregiver.  Caregiver reporting brief onset of what is described as a "stiffening" and a less than 1 minute episode of unresponsiveness.  This occurred 1 day prior to arrival.  Per caregiver, patient at his  neurologic baseline.  Patient is very difficult to understand as he has very slurred speech which caregiver says this is normal for him.  With  exception of speech, patient has no focal neurologic deficits on his exam today.  I believe pt was trying to explain to me that he was straining to have a bowel movement when the incident occurred although  It was difficult for me to understand him.  Will obtain labs and CT head.  Patient resting comfortably, no distress.  Vital signs reassuring. Labs interpreted by me, no leukocytosis.  UA unremarked [place] no significant electrolyte derangement.  CT head without acute findings, chest x-ray also without acute findings.  EKG no acute ischemic change.  Reason for patient's brief episode of symptoms is unclear, but possibly related to new onset seizure.  No concerning findings for infectious or cardiac process.  Care plan discussed with Dr. Gilford Raid. Pt appears appropriate for d/c back to facilty, pt and caregiver agreeable to f/u with neurology and info provided for Dr. Merlene Laughter.    The patient appears reasonably screened and/or stabilized for discharge and I doubt any other medical condition or other [facility] requiring further screening, evaluation, or treatment in the ED at this time prior to discharge.    Final Clinical Impression(s) / ED Diagnoses Final diagnoses:  Seizure [facility])    Rx / [place] Orders ED Discharge Orders    None       Kem Parkinson, PA-C 08/02/20 1357    Isla Pence, MD 08/02/20 1432

## 2020-08-01 NOTE — ED Notes (Signed)
Provided family member with soda advised her not to allow pt to drink anything at this time

## 2020-08-01 NOTE — Discharge Instructions (Addendum)
Your work-up today was reassuring.  No evidence of infection.  The episode you had yesterday may be related to a seizure.  It is important that you contact Dr. Freddie Apley office to arrange a follow-up appointment.  Return to the emergency department for any new or worsening symptoms.

## 2020-08-21 ENCOUNTER — Ambulatory Visit: Payer: Medicare Other | Admitting: Psychiatry

## 2020-08-28 ENCOUNTER — Ambulatory Visit (INDEPENDENT_AMBULATORY_CARE_PROVIDER_SITE_OTHER): Payer: Medicare Other | Admitting: Internal Medicine

## 2020-08-28 ENCOUNTER — Other Ambulatory Visit: Payer: Self-pay

## 2020-08-28 ENCOUNTER — Encounter: Payer: Self-pay | Admitting: Internal Medicine

## 2020-08-28 VITALS — BP 122/72 | HR 71 | Ht 74.0 in | Wt 205.6 lb

## 2020-08-28 DIAGNOSIS — I10 Essential (primary) hypertension: Secondary | ICD-10-CM

## 2020-08-28 MED ORDER — AMLODIPINE BESYLATE 5 MG PO TABS: 5.0000 mg | ORAL_TABLET | Freq: Every day | ORAL | 3 refills | Status: AC

## 2020-08-28 NOTE — Progress Notes (Signed)
Cardiology Office Note   Date:  08/28/2020   ID:  DAMARCUS REGGIO, DOB 1960-11-09, MRN [id number]  PCP:  Lauretta Grill, NP  Cardiologist:   Dorris Carnes, MD   Pt referred for evaluation of HTN       History of Present Illness: Austin Mcdonald is a 60 y.o. male with a history of DM, schizophrneia and labile HTN   He is followed by [facility]  BP at nursing facility ahs been up and down    He is currently on hydralazine 10 tid and lisinopril 40 daily  The pt says his breathing is OK   He denis CP  No dizziness   Was seen recently for a seizure       Current Meds  Medication Sig  . aspirin 81 MG tablet Take 81 mg by mouth daily.  Marland Kitchen azelastine (ASTELIN) 0.1 % nasal spray Place 1 spray into both nostrils 2 (two) times daily. Use in each nostril as directed  . cetirizine (ZYRTEC) 10 MG tablet Take 10 mg by mouth daily.  . citalopram (CELEXA) 20 MG tablet Take 1 tablet (20 mg total) by mouth daily.  . clonazePAM (KLONOPIN) 2 MG tablet Take 1 tablet (2 mg total) by mouth at bedtime.  . donepezil (ARICEPT) 10 MG tablet Take 1 tablet (10 mg total) by mouth at bedtime.  . fluticasone (FLONASE) 50 MCG/ACT nasal spray Place into both nostrils daily.  . hydrALAZINE (APRESOLINE) 10 MG tablet Take 10 mg by mouth 3 (three) times daily.  Marland Kitchen lisinopril (ZESTRIL) 40 MG tablet Take 40 mg by mouth 2 (two) times daily.  Marland Kitchen loratadine (CLARITIN) 10 MG tablet Take 10 mg by mouth daily.  . meloxicam (MOBIC) 15 MG tablet Take 15 mg by mouth daily.  . metFORMIN (GLUCOPHAGE) 500 MG tablet Take 500 mg by mouth 2 (two) times daily with a meal.   . pantoprazole (PROTONIX) 40 MG tablet Take 1 tablet (40 mg total) by mouth daily.  . pravastatin (PRAVACHOL) 10 MG tablet Take 10 mg by mouth daily.  . risperiDONE (RISPERDAL) 3 MG tablet Take 1 tablet (3 mg total) by mouth 2 (two) times daily.  . traZODone (DESYREL) 100 MG tablet Take 1 tablet (100 mg total) by mouth at bedtime.     Allergies:   Patient has no  known allergies.   Past Medical History:  Diagnosis Date  . Depression   . Diabetes ([place])   . GERD (gastroesophageal reflux disease) 05/09/2016  . Schizophrenic disorder [facility])     Past Surgical History:  Procedure Laterality Date  . APPENDECTOMY    . COLONOSCOPY N/A 02/23/2015   Procedure: COLONOSCOPY;  Surgeon: Rogene Houston, MD;  Location: [facility];  Service: Endoscopy;  Laterality: N/A;  . ESOPHAGEAL DILATION N/A 02/23/2015   Procedure: ESOPHAGEAL DILATION;  Surgeon: Rogene Houston, MD;  Location: [facility];  Service: Endoscopy;  Laterality: N/A;  . ESOPHAGOGASTRODUODENOSCOPY N/A 02/23/2015   Procedure: ESOPHAGOGASTRODUODENOSCOPY (EGD);  Surgeon: Rogene Houston, MD;  Location[facility];  Service: Endoscopy;  Laterality: N/A;  2:00     Social History:  The patient  reports that he has been smoking cigarettes. He has been smoking about 0.50 packs per day. He has never used smokeless tobacco. He reports that he does not drink alcohol and does not use drugs.   Family History:  The patient's family history is not on file.    ROS:  Please see the history of present  illness. All other systems are reviewed and  Negative to the above problem except as noted.    PHYSICAL EXAM: VS:  BP 122/72   Pulse 71   Ht 6\' 2"  (1.88 m)   Wt 205 lb 9.6 oz (93.3 kg)   SpO2 96%   BMI 26.40 kg/m   GEN: Well nourished, well developed, in no acute distress  HEENT: normal  Neck: no JVD, carotid bruits Cardiac: RRR; no murmurs, No LE  edema  Respiratory:  clear to auscultation bilaterally, normal work of breathing GI: soft, nontender, nondistended, + BS  No hepatomegaly  MS: no deformity Moving all extremities   Skin: warm and dry, no rash Neuro:  Strength and sensation are intact Psych: euthymic mood, full affect   EKG:  EKG is not ordered today.  ON 08/01/20:   SR 93 bpm     Lipid Panel    Component Value Date/Time   CHOL 140 09/10/2019 0935   TRIG 36 09/10/2019 0935   HDL  45 09/10/2019 0935   CHOLHDL 3.1 09/10/2019 0935   LDLCALC 85 09/10/2019 0935      Wt Readings from Last 3 Encounters:  08/28/20 205 lb 9.6 oz (93.3 kg)  08/01/20 198 lb 6.6 oz (90 kg)  08/26/19 198 lb 1.6 oz (89.9 kg)      ASSESSMENT AND PLAN:  1  HTN   Pts BP OK today but has been labile   He is on lisinopril   I wonder if hydralazine is leading to labile nature with shorter T 1/2 and quick on  I would recomm stopping hydralazine and putting on amlodipine 5 mg instead. F/U  By PCP  2  HL   Keep on pravastatin  3  DM  On oral agents     Current medicines are reviewed at length with the patient today.  The patient does not have concerns regarding medicines.  Signed, Dorris Carnes, MD  08/28/2020 2:17 PM    [facility] [address], [place], [place]  [address] Phone: [phone]; Fax: [phone]

## 2020-08-28 NOTE — Patient Instructions (Addendum)
Medication Instructions:  Your physician has recommended you make the following change in your medication:   Stop Taking hydrazine  Start Norvasc 2.5 mg for 3 Days then increase to 5 mg Daily   *If you need a refill on your cardiac medications before your next appointment, please call your pharmacy*   Lab Work: NONE   If you have labs (blood work) drawn today and your tests are completely normal, you will receive your results only by: Marland Kitchen MyChart Message (if you have MyChart) OR . A paper copy in the mail If you have any lab test that is abnormal or we need to change your treatment, we will call you to review the results.   Testing/Procedures: NONE   Follow-Up: At [facility], you and your health needs are our priority.  As part of our continuing mission to provide you with exceptional heart care, we have created designated Provider Care Teams.  These Care Teams include your primary Cardiologist (physician) and Advanced Practice Providers (APPs -  Physician Assistants and Nurse Practitioners) who all work together to provide you with the care you need, when you need it.  We recommend signing up for the patient portal called "MyChart".  Sign up information is provided on this After Visit Summary.  MyChart is used to connect with patients for Virtual Visits (Telemedicine).  Patients are able to view lab/test results, encounter notes, upcoming appointments, etc.  Non-urgent messages can be sent to your provider as well.   To learn more about what you can do with MyChart, go to [email or web address].    Your next appointment:   6 week(s)  The format for your next appointment:   In Person  Provider:   Dorris Carnes, MD   Other Instructions Thank you for choosing [facility]!

## 2020-08-30 ENCOUNTER — Ambulatory Visit (INDEPENDENT_AMBULATORY_CARE_PROVIDER_SITE_OTHER): Payer: Medicare Other | Admitting: Gastroenterology

## 2020-08-31 ENCOUNTER — Other Ambulatory Visit: Payer: Self-pay

## 2020-08-31 ENCOUNTER — Ambulatory Visit (INDEPENDENT_AMBULATORY_CARE_PROVIDER_SITE_OTHER): Payer: Medicare Other | Admitting: Gastroenterology

## 2020-08-31 ENCOUNTER — Encounter (INDEPENDENT_AMBULATORY_CARE_PROVIDER_SITE_OTHER): Payer: Self-pay | Admitting: Gastroenterology

## 2020-08-31 VITALS — BP 144/75 | HR 52 | Temp 98.4°F | Ht 73.0 in | Wt 202.0 lb

## 2020-08-31 DIAGNOSIS — K219 Gastro-esophageal reflux disease without esophagitis: Secondary | ICD-10-CM | POA: Diagnosis not present

## 2020-08-31 MED ORDER — OMEPRAZOLE 20 MG PO CPDR: 20.0000 mg | DELAYED_RELEASE_CAPSULE | Freq: Every day | ORAL | 3 refills | Status: DC

## 2020-08-31 NOTE — Progress Notes (Signed)
Maylon Peppers, M.D. Gastroenterology & Hepatology [facility] [address] [place], [place] [address]  Primary Care Physician: Lauretta Grill, NP [address] [address] [place] [address]  I will communicate my assessment and recommendations to the referring MD via EMR.  Problems: GERD  History of Present Illness: Austin Mcdonald is a 60 y.o. male with past medical history of depression, diabetes, GERD and schizophrenia, who presents for follow up of GERD.  The patient was last seen on 08/26/2019. At that time, the patient was advised to continue taking pantoprazole 40 mg every day and to continue with dietary recommendations (chopped food and eating slowly).  Patient comes to the appointment with member of the nursing home.  He has no complaints.  States feeling well and has tolerated the diet and medication adequately.  He has been eating well, denies having any heartburn, dysphagia or odynophagia. The patient denies having any nausea, vomiting, fever, chills, hematochezia, melena, hematemesis, abdominal distention, abdominal pain, diarrhea, jaundice, pruritus or weight loss.  Last Colonoscopy: 2016-Examination performed to cecum. Redundant colon. Small cecal polyp ablated via cold biopsy. Path TA. Two small diverticula noted as above. Prominent internal hemorrhoids with small external hemorrhoids 7 year repeat recommended.    Last Endoscopy: 2016-No evidence of esophageal stricture or ring formation. Gastritis and body and antrum with multiple erosions and a small antral ulcer. Multiple biopsies taken from mucosa at gastric body for routine histology. Esophagus dilated by passing 56 French Maloney dilator but no disruption noted to esophageal mucosa.  Past Medical History: Past Medical History:  Diagnosis Date   Depression    Diabetes ([place])    GERD (gastroesophageal reflux disease) 05/09/2016   Schizophrenic disorder ([place])     Past  Surgical History: Past Surgical History:  Procedure Laterality Date   APPENDECTOMY     COLONOSCOPY N/A 02/23/2015   Procedure: COLONOSCOPY;  Surgeon: Rogene Houston, MD;  [facility];  Service: Endoscopy;  Laterality: N/A;   ESOPHAGEAL DILATION N/A 02/23/2015   Procedure: ESOPHAGEAL DILATION;  Surgeon: Rogene Houston, MD;  [facility];  Service: Endoscopy;  Laterality: N/A;   ESOPHAGOGASTRODUODENOSCOPY N/A 02/23/2015   Procedure: ESOPHAGOGASTRODUODENOSCOPY (EGD);  Surgeon: Rogene Houston, MD;  [facility];  Service: Endoscopy;  Laterality: N/A;  2:00    Family History:History reviewed. No pertinent family history.  Social History: Social History   Tobacco Use  Smoking Status Every Day   Packs/day: 0.50   Pack years: 0.00   Types: Cigarettes  Smokeless Tobacco Never  Tobacco Comments   10 cigarettes a day   Social History   Substance and Sexual Activity  Alcohol Use No   Alcohol/week: 0.0 standard drinks   Social History   Substance and Sexual Activity  Drug Use No    Allergies: No Known Allergies  Medications: Current Outpatient Medications  Medication Sig Dispense Refill   amLODipine (NORVASC) 5 MG tablet Take 1 tablet (5 mg total) by mouth daily. 90 tablet 3   aspirin 81 MG tablet Take 81 mg by mouth daily.     azelastine (ASTELIN) 0.1 % nasal spray Place 1 spray into both nostrils 2 (two) times daily. Use in each nostril as directed     cetirizine (ZYRTEC) 10 MG tablet Take 10 mg by mouth daily.     citalopram (CELEXA) 20 MG tablet Take 1 tablet (20 mg total) by mouth daily. 30 tablet 2   clonazePAM (KLONOPIN) 2 MG tablet  Take 1 tablet (2 mg total) by mouth at bedtime. 30 tablet 2   diclofenac Sodium (VOLTAREN) 1 % GEL Apply topically in the morning and at bedtime.     donepezil (ARICEPT) 10 MG tablet Take 1 tablet (10 mg total) by mouth at bedtime. 30 tablet 2   fluticasone (FLONASE) 50 MCG/ACT nasal spray Place into both  nostrils daily.     lisinopril (ZESTRIL) 40 MG tablet Take 40 mg by mouth 2 (two) times daily.     meloxicam (MOBIC) 15 MG tablet Take 15 mg by mouth daily.     metFORMIN (GLUCOPHAGE) 500 MG tablet Take 500 mg by mouth daily.     pantoprazole (PROTONIX) 40 MG tablet Take 1 tablet (40 mg total) by mouth daily. 30 tablet 11   pravastatin (PRAVACHOL) 10 MG tablet Take 10 mg by mouth daily.     risperiDONE (RISPERDAL) 3 MG tablet Take 1 tablet (3 mg total) by mouth 2 (two) times daily. 60 tablet 2   traZODone (DESYREL) 100 MG tablet Take 1 tablet (100 mg total) by mouth at bedtime. 30 tablet 2   No current facility-administered medications for this visit.    Review of Systems: GENERAL: negative for malaise, night sweats HEENT: No changes in hearing or vision, no nose bleeds or other nasal problems. NECK: Negative for lumps, goiter, pain and significant neck swelling RESPIRATORY: Negative for cough, wheezing CARDIOVASCULAR: Negative for chest pain, leg swelling, palpitations, orthopnea GI: SEE HPI MUSCULOSKELETAL: Negative for joint pain or swelling, back pain, and muscle pain. SKIN: Negative for lesions, rash PSYCH: Negative for sleep disturbance, mood disorder and recent psychosocial stressors. HEMATOLOGY Negative for prolonged bleeding, bruising easily, and swollen nodes. ENDOCRINE: Negative for cold or heat intolerance, polyuria, polydipsia and goiter. NEURO: negative for tremor, gait imbalance, syncope and seizures. The remainder of the review of systems is noncontributory.   Physical Exam: BP (!) 144/75 (BP Location: Right Arm, Patient Position: Sitting, Cuff Size: Large)   Pulse (!) 52   Temp 98.4 F (36.9 C) (Oral)   Ht 6\' 1"  (1.854 m)   Wt 202 lb (91.6 kg)   BMI 26.65 kg/m  GENERAL: The patient is AO x3, in no acute distress. HEENT: Head is normocephalic and atraumatic. EOMI are intact. Mouth is well hydrated and without lesions. NECK: Supple. No masses LUNGS: Clear to  auscultation. No presence of rhonchi/wheezing/rales. Adequate chest expansion HEART: RRR, normal s1 and s2. ABDOMEN: Soft, nontender, no guarding, no peritoneal signs, and nondistended. BS +. No masses. EXTREMITIES: Without any cyanosis, clubbing, rash, lesions or edema. NEUROLOGIC: AOx3, no focal motor deficit. SKIN: no jaundice, no rashes  Imaging/Labs: as above  I personally reviewed and interpreted the available labs, imaging and endoscopic files.  Impression and Plan: Austin Mcdonald is a 60 y.o. male with past medical history of depression, diabetes, GERD and schizophrenia, who presents for follow up of GERD.  Patient has tolerated the medication adequately, with adequate control of his  reflux episodes.  Discussion was held with the staff of his nursing facility to decrease the dosage of the omeprazole to 20 mg every day.  If the patient were to have recurrence of symptoms the medication can be increased back to 40 mg daily.  - Decrease omeprazole 20 mg qday - RTC 1 year  All questions were answered.      Harvel Quale, MD Gastroenterology and Hepatology [facility]

## 2020-08-31 NOTE — Patient Instructions (Signed)
Decrease omeprazole 20 mg qday

## 2020-09-07 ENCOUNTER — Telehealth: Payer: Self-pay

## 2020-09-07 ENCOUNTER — Other Ambulatory Visit: Payer: Self-pay | Admitting: Psychiatry

## 2020-09-07 MED ORDER — RISPERIDONE 3 MG PO TABS: 3.0000 mg | ORAL_TABLET | Freq: Two times a day (BID) | ORAL | 0 refills | Status: AC

## 2020-09-07 MED ORDER — DONEPEZIL HCL 10 MG PO TABS: 10.0000 mg | ORAL_TABLET | Freq: Every day | ORAL | 3 refills | Status: AC

## 2020-09-07 MED ORDER — TRAZODONE HCL 100 MG PO TABS: 100.0000 mg | ORAL_TABLET | Freq: Every day | ORAL | 3 refills | Status: AC

## 2020-09-07 MED ORDER — CITALOPRAM HYDROBROMIDE 20 MG PO TABS: 20.0000 mg | ORAL_TABLET | Freq: Every day | ORAL | 3 refills | Status: DC

## 2020-09-07 NOTE — Telephone Encounter (Signed)
received fax that a refil was needed on the trazodone 100mg  last written 05-24-20 last dispensed 08-10-20

## 2020-09-07 NOTE — Telephone Encounter (Signed)
received fax for refill on the donepezil hcl 10mg , citalipram 20mg , risperidone 3mg 

## 2020-09-07 NOTE — Telephone Encounter (Signed)
Ordered

## 2020-09-07 NOTE — Progress Notes (Deleted)
BH MD/PA/NP OP Progress Note  09/07/2020 5:40 PM Austin Mcdonald  MRN:  [id number]  Chief Complaint:  HPI: - Per chart review, he went to ED for altered mental status. "Caregiver states that he was in the bathroom at approximately 5:30 PM to urinate when he suddenly became "stiff and unresponsive" episode lasted approximately 1 minute.  He was incontinent of stool at the time.  EMS was contacted and evaluated the patient but he refused to come to the hospital." He was referred to neurology for evaluation of seizure.    Visit Diagnosis: No diagnosis found.  Past Psychiatric History: Please see initial evaluation for full details. I have reviewed the history. No updates at this time.     Past Medical History:  Past Medical History:  Diagnosis Date   Depression    Diabetes ([place])    GERD (gastroesophageal reflux disease) 05/09/2016   Schizophrenic disorder ([place])     Past Surgical History:  Procedure Laterality Date   APPENDECTOMY     COLONOSCOPY N/A 02/23/2015   Procedure: COLONOSCOPY;  Surgeon: Rogene Houston, MD;  [facility];  Service: Endoscopy;  Laterality: N/A;   ESOPHAGEAL DILATION N/A 02/23/2015   Procedure: ESOPHAGEAL DILATION;  Surgeon: Rogene Houston, MD;  [facility];  Service: Endoscopy;  Laterality: N/A;   ESOPHAGOGASTRODUODENOSCOPY N/A 02/23/2015   Procedure: ESOPHAGOGASTRODUODENOSCOPY (EGD);  Surgeon: Rogene Houston, MD;  [facility];  Service: Endoscopy;  Laterality: N/A;  2:00    Family Psychiatric History: Please see initial evaluation for full details. I have reviewed the history. No updates at this time.     Family History: No family history on file.  Social History:  Social History   Socioeconomic History   Marital status: Single    Spouse name: Not on file   Number of children: Not on file   Years of education: Not on file   Highest education level: Not on file  Occupational History   Not on file  Tobacco Use    Smoking status: Every Day    Packs/day: 0.50    Pack years: 0.00    Types: Cigarettes   Smokeless tobacco: Never   Tobacco comments:    10 cigarettes a day  Vaping Use   Vaping Use: Never used  Substance and Sexual Activity   Alcohol use: No    Alcohol/week: 0.0 standard drinks   Drug use: No   Sexual activity: Not on file  Other Topics Concern   Not on file  Social History Narrative   Not on file   Social Determinants of Health   Financial Resource Strain: Not on file  Food Insecurity: Not on file  Transportation Needs: Not on file  Physical Activity: Not on file  Stress: Not on file  Social Connections: Not on file    Allergies: No Known Allergies  Metabolic Disorder Labs: No results found for: HGBA1C, MPG No results found for: PROLACTIN Lab Results  Component Value Date   CHOL 140 09/10/2019   TRIG 36 09/10/2019   HDL 45 09/10/2019   CHOLHDL 3.1 09/10/2019   [place] 85 09/10/2019   Lab Results  Component Value Date   TSH 0.52 12/08/2017    Therapeutic Level Labs: No results found for: LITHIUM No results found for: VALPROATE No components found for:  CBMZ  Current Medications: Current Outpatient Medications  Medication Sig Dispense Refill   amLODipine (NORVASC) 5 MG tablet Take 1 tablet (5 mg total) by mouth  daily. 90 tablet 3   aspirin 81 MG tablet Take 81 mg by mouth daily.     azelastine (ASTELIN) 0.1 % nasal spray Place 1 spray into both nostrils 2 (two) times daily. Use in each nostril as directed     cetirizine (ZYRTEC) 10 MG tablet Take 10 mg by mouth daily.     citalopram (CELEXA) 20 MG tablet Take 1 tablet (20 mg total) by mouth daily. 30 tablet 3   clonazePAM (KLONOPIN) 2 MG tablet Take 1 tablet (2 mg total) by mouth at bedtime. 30 tablet 2   diclofenac Sodium (VOLTAREN) 1 % GEL Apply topically in the morning and at bedtime.     donepezil (ARICEPT) 10 MG tablet Take 1 tablet (10 mg total) by mouth at bedtime. 30 tablet 3   fluticasone  (FLONASE) 50 MCG/ACT nasal spray Place into both nostrils daily.     lisinopril (ZESTRIL) 40 MG tablet Take 40 mg by mouth 2 (two) times daily.     meloxicam (MOBIC) 15 MG tablet Take 15 mg by mouth daily.     metFORMIN (GLUCOPHAGE) 500 MG tablet Take 500 mg by mouth daily.     omeprazole (PRILOSEC) 20 MG capsule Take 1 capsule (20 mg total) by mouth daily. 90 capsule 3   pravastatin (PRAVACHOL) 10 MG tablet Take 10 mg by mouth daily.     risperiDONE (RISPERDAL) 3 MG tablet Take 1 tablet (3 mg total) by mouth 2 (two) times daily. 60 tablet 0   traZODone (DESYREL) 100 MG tablet Take 1 tablet (100 mg total) by mouth at bedtime. 30 tablet 3   No current facility-administered medications for this visit.     Musculoskeletal: Strength & Muscle Tone:  N/A Gait & Station:  N/A Patient leans: N/A  Psychiatric Specialty Exam: Review of Systems  There were no vitals taken for this visit.There is no height or weight on file to calculate BMI.  General Appearance: {Appearance:22683}  Eye Contact:  {BHH EYE CONTACT:22684}  Speech:  Clear and Coherent  Volume:  Normal  Mood:  {BHH MOOD:22306}  Affect:  {Affect (PAA):22687}  Thought Process:  Coherent  Orientation:  Full (Time, Place, and Person)  Thought Content: Logical   Suicidal Thoughts:  {ST/HT (PAA):22692}  Homicidal Thoughts:  {ST/HT (PAA):22692}  Memory:  Immediate;   Good  Judgement:  {Judgement (PAA):22694}  Insight:  {Insight (PAA):22695}  Psychomotor Activity:  Normal  Concentration:  Concentration: Good and Attention Span: Good  Recall:  Good  Fund of Knowledge: Good  Language: Good  Akathisia:  No  Handed:  Right  AIMS (if indicated): not done  Assets:  Communication Skills Desire for Improvement  ADL's:  Intact  Cognition: WNL  Sleep:  {BHH GOOD/FAIR/POOR:22877}   Screenings: Flowsheet Row ED from 08/01/2020 in [facility] No Risk        Assessment and Plan:  Austin Mcdonald is a 60 y.o. year old male with a history of schizophrenia, tardive dyskinesia, hyperlipidemia, diabetes, who presents for follow up appointment for below.   1. Schizophrenia, unspecified type ([place]) There has been no significant behavior issues despite tapering down Risperdal.  Discussed potential metabolic side effect and EPS.  Will continue citalopram to target depression and anxiety.  Will continue clonazepam as needed for anxiety.  Will continue trazodone as needed for insomnia.    # r/o mild neurocognitive disorder He does have a cognitive deficits as characterized by [place].  We will continue donepezil  for cognitive impairment.    Plan  1. Continue risperidone 3 mg twice a day 2. Continue citalopram 20 mg daily  3. Continue donepezil 10 mg daily 4. Continue clonazepam 2 mg qhs 5. Discontinue benztropine  5. Continue Trazodone 100 mg at night 6. Next appointment: 5/30 at 9:30 for 30 mins, in person  Fax: [phone]  Reviewed labs- checked June 2021. BMP, lipid panels wnl - obtain labs at the next visit    The patient demonstrates the following risk factors for suicide: Chronic risk factors for suicide include: psychiatric disorder of schizophrenia. Acute risk factors for suicide include: unemployment. Protective factors for this patient include: positive social support. Considering these factors, the overall suicide risk at this point appears to be low. Patient is appropriate for outpatient follow up.     [name], MD 09/07/2020, 5:40 PM

## 2020-09-11 ENCOUNTER — Ambulatory Visit: Payer: Medicare Other | Admitting: Psychiatry

## 2020-10-09 ENCOUNTER — Other Ambulatory Visit: Payer: Self-pay

## 2020-10-09 ENCOUNTER — Ambulatory Visit (INDEPENDENT_AMBULATORY_CARE_PROVIDER_SITE_OTHER): Payer: Medicare Other | Admitting: Internal Medicine

## 2020-10-09 ENCOUNTER — Encounter: Payer: Self-pay | Admitting: Internal Medicine

## 2020-10-09 VITALS — BP 132/72 | HR 58 | Ht 74.0 in | Wt 200.0 lb

## 2020-10-09 DIAGNOSIS — I10 Essential (primary) hypertension: Secondary | ICD-10-CM | POA: Diagnosis not present

## 2020-10-09 NOTE — Progress Notes (Signed)
Cardiology Office Note   Date:  10/09/2020   ID:  Austin Mcdonald, DOB Nov 28, 1960, MRN [id number]  PCP:  Lauretta Grill, NP  Cardiologist:   Dorris Carnes, MD   Pt referred for evaluation of HTN       History of Present Illness: Austin Mcdonald is a 60 y.o. male with a history of DM, schizophrneia and labile HTN   He is followed by [facility]  BP at nursing facility ahs been up and down    He is currently on hydralazine 10 tid and lisinopril 40 daily I Saw the pt in June 2022  At that time I recomm switching him from hydralazine to amlodipine     Since seen he has done OK   Breathing is good   No CP   No dizziness  BP log from center :    110s to 140/       Current Meds  Medication Sig   amLODipine (NORVASC) 5 MG tablet Take 1 tablet (5 mg total) by mouth daily.   aspirin 81 MG tablet Take 81 mg by mouth daily.   azelastine (ASTELIN) 0.1 % nasal spray Place 1 spray into both nostrils 2 (two) times daily. Use in each nostril as directed   cetirizine (ZYRTEC) 10 MG tablet Take 10 mg by mouth daily.   citalopram (CELEXA) 20 MG tablet Take 1 tablet (20 mg total) by mouth daily.   clonazePAM (KLONOPIN) 2 MG tablet Take 1 tablet (2 mg total) by mouth at bedtime.   diclofenac Sodium (VOLTAREN) 1 % GEL Apply topically in the morning and at bedtime.   donepezil (ARICEPT) 10 MG tablet Take 1 tablet (10 mg total) by mouth at bedtime.   fluticasone (FLONASE) 50 MCG/ACT nasal spray Place into both nostrils daily.   lisinopril (ZESTRIL) 40 MG tablet Take 40 mg by mouth 2 (two) times daily.   meloxicam (MOBIC) 15 MG tablet Take 15 mg by mouth daily.   metFORMIN (GLUCOPHAGE) 500 MG tablet Take 500 mg by mouth daily.   omeprazole (PRILOSEC) 20 MG capsule Take 1 capsule (20 mg total) by mouth daily.   pravastatin (PRAVACHOL) 10 MG tablet Take 10 mg by mouth daily.   risperiDONE (RISPERDAL) 3 MG tablet Take 1 tablet (3 mg total) by mouth 2 (two) times daily.   traZODone (DESYREL) 100 MG tablet  Take 1 tablet (100 mg total) by mouth at bedtime.     Allergies:   Patient has no known allergies.   Past Medical History:  Diagnosis Date   Depression    Diabetes ([place])    GERD (gastroesophageal reflux disease) 05/09/2016   Schizophrenic disorder ([place])     Past Surgical History:  Procedure Laterality Date   APPENDECTOMY     COLONOSCOPY N/A 02/23/2015   Procedure: COLONOSCOPY;  Surgeon: Rogene Houston, MD;  [facility];  Service: Endoscopy;  Laterality: N/A;   ESOPHAGEAL DILATION N/A 02/23/2015   Procedure: ESOPHAGEAL DILATION;  Surgeon: Rogene Houston, MD;  [facility];  Service: Endoscopy;  Laterality: N/A;   ESOPHAGOGASTRODUODENOSCOPY N/A 02/23/2015   Procedure: ESOPHAGOGASTRODUODENOSCOPY (EGD);  Surgeon: Rogene Houston, MD;  [facility];  Service: Endoscopy;  Laterality: N/A;  2:00     Social History:  The patient  reports that he has been smoking cigarettes. He has been smoking an average of .5 packs per day. He has never used smokeless tobacco. He reports that he does not drink alcohol and  does not use drugs.   Family History:  The patient's family history is not on file.    ROS:  Please see the history of present illness. All other systems are reviewed and  Negative to the above problem except as noted.    PHYSICAL EXAM: VS:  BP 132/72   Pulse (!) 58   Ht 6\' 2"  (1.88 m)   Wt 200 lb (90.7 kg)   SpO2 97%   BMI 25.68 kg/m   GEN: Well nourished, well developed, in no acute distress  HEENT: normal  Neck: no JVD, Cardiac: RRR; no murmurs, No LE  edema  Respiratory:  clear to auscultation bilaterally, GI: soft, nontender, nondistended, + BS  No hepatomegaly  MS: no deformity Moving all extremities   Skin: warm and dry, no rash Neuro:  Strength and sensation are intact Psych: euthymic mood, full affect   EKG:  EKG is not ordered today.  Lipid Panel    Component Value Date/Time   CHOL 140 09/10/2019 0935   TRIG 36 09/10/2019  0935   HDL 45 09/10/2019 0935   CHOLHDL 3.1 09/10/2019 0935   LDLCALC 85 09/10/2019 0935      Wt Readings from Last 3 Encounters:  10/09/20 200 lb (90.7 kg)  08/31/20 202 lb (91.6 kg)  08/28/20 205 lb 9.6 oz (93.3 kg)      ASSESSMENT AND PLAN:  1  HTN   BP is much better   Keep on current regimen    2  HL  Would continue pravastatin  3  DM  On oral agents  Will be available as needed if BP changes, becomes uncontrolled      Current medicines are reviewed at length with the patient today.  The patient does not have concerns regarding medicines.  Signed, Dorris Carnes, MD  10/09/2020 1:43 PM    [facility] [place], [place], [place]  [address] Phone: [phone]; Fax: [phone]

## 2020-10-09 NOTE — Patient Instructions (Signed)
Medication Instructions:  Your physician recommends that you continue on your current medications as directed. Please refer to the Current Medication list given to you today.  *If you need a refill on your cardiac medications before your next appointment, please call your pharmacy*   Lab Work: None If you have labs (blood work) drawn today and your tests are completely normal, you will receive your results only by: MyChart Message (if you have MyChart) OR A paper copy in the mail If you have any lab test that is abnormal or we need to change your treatment, we will call you to review the results.   Testing/Procedures: None   Follow-Up: At [facility], you and your health needs are our priority.  As part of our continuing mission to provide you with exceptional heart care, we have created designated Provider Care Teams.  These Care Teams include your primary Cardiologist (physician) and Advanced Practice Providers (APPs -  Physician Assistants and Nurse Practitioners) who all work together to provide you with the care you need, when you need it.  We recommend signing up for the patient portal called "MyChart".  Sign up information is provided on this After Visit Summary.  MyChart is used to connect with patients for Virtual Visits (Telemedicine).  Patients are able to view lab/test results, encounter notes, upcoming appointments, etc.  Non-urgent messages can be sent to your provider as well.   To learn more about what you can do with MyChart, go to [email or web address].    Your next appointment:   Follow Up: As Needed  Other Instructions    

## 2020-10-16 ENCOUNTER — Other Ambulatory Visit (HOSPITAL_COMMUNITY): Payer: Self-pay | Admitting: Neurology

## 2020-10-16 DIAGNOSIS — R569 Unspecified convulsions: Secondary | ICD-10-CM

## 2020-10-20 ENCOUNTER — Other Ambulatory Visit: Payer: Self-pay

## 2020-10-20 ENCOUNTER — Ambulatory Visit (HOSPITAL_COMMUNITY)
Admission: RE | Admit: 2020-10-20 | Discharge: 2020-10-20 | Disposition: A | Discharge status: Home / Self Care | Payer: Medicare Other | Source: Ambulatory Visit | Attending: Neurology | Admitting: Neurology

## 2020-10-20 DIAGNOSIS — Z7982 Long term (current) use of aspirin: Secondary | ICD-10-CM | POA: Insufficient documentation

## 2020-10-20 DIAGNOSIS — R569 Unspecified convulsions: Secondary | ICD-10-CM | POA: Diagnosis not present

## 2020-10-20 DIAGNOSIS — Z79899 Other long term (current) drug therapy: Secondary | ICD-10-CM | POA: Insufficient documentation

## 2020-10-20 NOTE — Procedures (Signed)
  McNeal A. Merlene Laughter, MD     [email or web address]           HISTORY: This is a 60 year old who presents with new onset seizures.  MEDICATIONS:  Current Outpatient Medications:    amLODipine (NORVASC) 5 MG tablet, Take 1 tablet (5 mg total) by mouth daily., Disp: 90 tablet, Rfl: 3   aspirin 81 MG tablet, Take 81 mg by mouth daily., Disp: , Rfl:    azelastine (ASTELIN) 0.1 % nasal spray, Place 1 spray into both nostrils 2 (two) times daily. Use in each nostril as directed, Disp: , Rfl:    cetirizine (ZYRTEC) 10 MG tablet, Take 10 mg by mouth daily., Disp: , Rfl:    citalopram (CELEXA) 20 MG tablet, Take 1 tablet (20 mg total) by mouth daily., Disp: 30 tablet, Rfl: 3   clonazePAM (KLONOPIN) 2 MG tablet, Take 1 tablet (2 mg total) by mouth at bedtime., Disp: 30 tablet, Rfl: 2   diclofenac Sodium (VOLTAREN) 1 % GEL, Apply topically in the morning and at bedtime., Disp: , Rfl:    donepezil (ARICEPT) 10 MG tablet, Take 1 tablet (10 mg total) by mouth at bedtime., Disp: 30 tablet, Rfl: 3   fluticasone (FLONASE) 50 MCG/ACT nasal spray, Place into both nostrils daily., Disp: , Rfl:    lisinopril (ZESTRIL) 40 MG tablet, Take 40 mg by mouth 2 (two) times daily., Disp: , Rfl:    meloxicam (MOBIC) 15 MG tablet, Take 15 mg by mouth daily., Disp: , Rfl:    metFORMIN (GLUCOPHAGE) 500 MG tablet, Take 500 mg by mouth daily., Disp: , Rfl:    omeprazole (PRILOSEC) 20 MG capsule, Take 1 capsule (20 mg total) by mouth daily., Disp: 90 capsule, Rfl: 3   pravastatin (PRAVACHOL) 10 MG tablet, Take 10 mg by mouth daily., Disp: , Rfl:    risperiDONE (RISPERDAL) 3 MG tablet, Take 1 tablet (3 mg total) by mouth 2 (two) times daily., Disp: 60 tablet, Rfl: 0   traZODone (DESYREL) 100 MG tablet, Take 1 tablet (100 mg total) by mouth at bedtime., Disp: 30 tablet, Rfl: 3     ANALYSIS: A 16 channel recording using standard 10 20 measurements is conducted for 25 minutes.  There is a well-formed  posterior dominant rhythm of 10 hertz which attenuates with opening. There is beta activity observed in frontal areas. Awake and drowsy architecture are documented. With the stimulation is carried out without abnormal changes in the background activity. There is no focal or lateral slowing. There is no epileptiform activity is noted.   IMPRESSION: 1. This is a normal recording awake drowsy states.       Chelbie Jarnagin A. Merlene Laughter, M.D.  Diplomate, Tax adviser of Psychiatry and Neurology ( Neurology).

## 2020-10-20 NOTE — Progress Notes (Signed)
EEG complete - results pending 

## 2020-10-22 ENCOUNTER — Emergency Department (HOSPITAL_COMMUNITY): Payer: Medicare Other

## 2020-10-22 ENCOUNTER — Other Ambulatory Visit: Payer: Self-pay

## 2020-10-22 ENCOUNTER — Emergency Department (HOSPITAL_COMMUNITY)
Admission: EM | Admit: 2020-10-22 | Discharge: 2020-10-22 | Disposition: A | Discharge status: Home / Self Care | Payer: Medicare Other | Attending: Emergency Medicine | Admitting: Emergency Medicine

## 2020-10-22 ENCOUNTER — Encounter (HOSPITAL_COMMUNITY): Payer: Self-pay

## 2020-10-22 DIAGNOSIS — X58XXXD Exposure to other specified factors, subsequent encounter: Secondary | ICD-10-CM | POA: Insufficient documentation

## 2020-10-22 DIAGNOSIS — E876 Hypokalemia: Secondary | ICD-10-CM | POA: Diagnosis not present

## 2020-10-22 DIAGNOSIS — F17210 Nicotine dependence, cigarettes, uncomplicated: Secondary | ICD-10-CM | POA: Diagnosis not present

## 2020-10-22 DIAGNOSIS — S01501D Unspecified open wound of lip, subsequent encounter: Secondary | ICD-10-CM | POA: Insufficient documentation

## 2020-10-22 DIAGNOSIS — R569 Unspecified convulsions: Secondary | ICD-10-CM | POA: Diagnosis not present

## 2020-10-22 DIAGNOSIS — E119 Type 2 diabetes mellitus without complications: Secondary | ICD-10-CM | POA: Insufficient documentation

## 2020-10-22 DIAGNOSIS — M542 Cervicalgia: Secondary | ICD-10-CM | POA: Insufficient documentation

## 2020-10-22 DIAGNOSIS — S0993XD Unspecified injury of face, subsequent encounter: Secondary | ICD-10-CM | POA: Diagnosis present

## 2020-10-22 LAB — CBC WITH DIFFERENTIAL/PLATELET
Abs Immature Granulocytes: 0.02 10*3/uL (ref 0.00–0.07)
Basophils Absolute: 0 10*3/uL (ref 0.0–0.1)
Basophils Relative: 0 %
Eosinophils Absolute: 0.1 10*3/uL (ref 0.0–0.5)
Eosinophils Relative: 2 %
HCT: 37 % — ABNORMAL LOW (ref 39.0–52.0)
Hemoglobin: 11.8 g/dL — ABNORMAL LOW (ref 13.0–17.0)
Immature Granulocytes: 0 %
Lymphocytes Relative: 20 %
Lymphs Abs: 1 10*3/uL (ref 0.7–4.0)
MCH: 30 pg (ref 26.0–34.0)
MCHC: 31.9 g/dL (ref 30.0–36.0)
MCV: 94.1 fL (ref 80.0–100.0)
Monocytes Absolute: 0.4 10*3/uL (ref 0.1–1.0)
Monocytes Relative: 8 %
Neutro Abs: 3.6 10*3/uL (ref 1.7–7.7)
Neutrophils Relative %: 70 %
Platelets: 223 10*3/uL (ref 150–400)
RBC: 3.93 MIL/uL — ABNORMAL LOW (ref 4.22–5.81)
RDW: 13.2 % (ref 11.5–15.5)
WBC: 5.2 10*3/uL (ref 4.0–10.5)
nRBC: 0 % (ref 0.0–0.2)

## 2020-10-22 LAB — URINALYSIS, ROUTINE W REFLEX MICROSCOPIC
Bacteria, UA: NONE SEEN
Bilirubin Urine: NEGATIVE
Glucose, UA: NEGATIVE mg/dL
Hgb urine dipstick: NEGATIVE
Ketones, ur: 5 mg/dL — AB
Leukocytes,Ua: NEGATIVE
Nitrite: NEGATIVE
Protein, ur: 30 mg/dL — AB
Specific Gravity, Urine: 1.025 (ref 1.005–1.030)
pH: 6 (ref 5.0–8.0)

## 2020-10-22 LAB — COMPREHENSIVE METABOLIC PANEL
ALT: 14 U/L (ref 0–44)
AST: 18 U/L (ref 15–41)
Albumin: 3.8 g/dL (ref 3.5–5.0)
Alkaline Phosphatase: 80 U/L (ref 38–126)
Anion gap: 8 (ref 5–15)
BUN: 24 mg/dL — ABNORMAL HIGH (ref 8–23)
CO2: 25 mmol/L (ref 22–32)
Calcium: 8.8 mg/dL — ABNORMAL LOW (ref 8.9–10.3)
Chloride: 104 mmol/L (ref 98–111)
Creatinine, Ser: 0.99 mg/dL (ref 0.61–1.24)
GFR, Estimated: >60 mL/min (ref >60)
Glucose, Bld: 137 mg/dL — ABNORMAL HIGH (ref 70–99)
Potassium: 3.2 mmol/L — ABNORMAL LOW (ref 3.5–5.1)
Sodium: 137 mmol/L (ref 135–145)
Total Bilirubin: 0.4 mg/dL (ref 0.3–1.2)
Total Protein: 6.9 g/dL (ref 6.5–8.1)

## 2020-10-22 LAB — MAGNESIUM: Magnesium: 1.9 mg/dL (ref 1.7–2.4)

## 2020-10-22 MED ORDER — LEVETIRACETAM IN NACL 1000 MG/100ML IV SOLN
1000.0000 mg | Freq: Once | INTRAVENOUS | Status: AC
  Administered 2020-10-22: 1000 mg via INTRAVENOUS
  Filled 2020-10-22: qty 100

## 2020-10-22 MED ORDER — LEVETIRACETAM 500 MG PO TABS: 500.0000 mg | ORAL_TABLET | Freq: Two times a day (BID) | ORAL | 0 refills | Status: DC

## 2020-10-22 MED ORDER — POTASSIUM CHLORIDE ER 10 MEQ PO TBCR: 10.0000 meq | EXTENDED_RELEASE_TABLET | Freq: Every day | ORAL | 0 refills | Status: DC

## 2020-10-22 NOTE — ED Notes (Signed)
Patient transported to CT 

## 2020-10-22 NOTE — ED Triage Notes (Signed)
Pt brought in by [facility] from [facility] in [place] for evaluation of a seizure earlier today. EMS report staff found Pt laying on ground outside today, but when EMS arrived Pt got up and walked over to their truck. Pt ambulatory in Triage A+O X4 and only c/o of right side nexk pain and staff at the home where he stays not giving him his prescribed medication.

## 2020-10-22 NOTE — Discharge Instructions (Addendum)
Please have the patient take keppra as directed.   The patient will need to follow up with Dr. Merlene Laughter within the next week for reassessment.   The patient was noted to have low potassium as well. I have prescribed a short course of potassium. He will need to have his labs rechecked in  a week.  Please return to the emergency department for any new or worsening symptoms.

## 2020-10-22 NOTE — ED Provider Notes (Signed)
[facility] EMERGENCY DEPARTMENT Provider Note   CSN: [id number] Arrival date & time: 10/22/20  1920     History Chief Complaint  Patient presents with   Seizures    Austin Mcdonald is a 60 y.o. male.  HPI   Pt is a 60 y/o male with a h/o depression, diabetes, GERD, schizophrenic disorder, dysphagia, who presents to the ED for eval of a possible seizure.   Per triage note, patient brought in by EMS from [facility] in [place] for Evaluation of a Seizure Earlier Today.  Per EMS, Staff Found Patient Lying on the Ground outside but When EMS Arrived Patient Got up and Walked over to [employer].  Patient Was Oriented and Was Complaining of Some Neck Pain at That Time and Stated That the Facility Was Not Giving Him His Medication  On my assessment the patient has no complaints.  He denies syncope or falls.  He denies any head trauma.  He is complaining of a wound to his right lower lip and a cut on his tongue.  He denies any neck or back pain.  Denies any chest pain, shortness of breath, vomiting, diarrhea or other complaints at this time. He denies a h/o seizures.  Caretaker is at bedside and states that patient had 2 witnessed seizure-like episodes today which were about an hour apart.  She states they last about 10 minutes and he had a confused.  After both of them.  She states that he had a similar episode earlier this year and followed with Dr. Merlene Laughter.  He has not been started on any antiepileptics and he has no known history of seizures prior to the last few months  Past Medical History:  Diagnosis Date   Depression    Diabetes ([place])    GERD (gastroesophageal reflux disease) 05/09/2016   Schizophrenic disorder [facility])     Patient Active Problem List   Diagnosis Date Noted   Schizophrenia ([place]) 07/22/2017   Dysphagia 05/09/2017   Diarrhea 05/09/2017   GERD (gastroesophageal reflux disease) 05/09/2016   Diabetes ([place]) 01/12/2015   Depression 01/12/2015    Past  Surgical History:  Procedure Laterality Date   APPENDECTOMY     COLONOSCOPY N/A 02/23/2015   Procedure: COLONOSCOPY;  Surgeon: Rogene Houston, MD;  Location: [facility];  Service: Endoscopy;  Laterality: N/A;   ESOPHAGEAL DILATION N/A 02/23/2015   Procedure: ESOPHAGEAL DILATION;  Surgeon: Rogene Houston, MD;  Location: [facility] END[facility];  Service: Endoscopy;  Laterality: N/A;   ESOPHAGOGASTRODUODENOSCOPY N/A 02/23/2015   Procedure: ESOPHAGOGASTRODUODENOSCOPY (EGD);  Surgeon: Rogene Houston, MD;  Location: [facility];  Service: Endoscopy;  Laterality: N/A;  2:00       History reviewed. No pertinent family history.  Social History   Tobacco Use   Smoking status: Every Day    Packs/day: 0.50    Types: Cigarettes   Smokeless tobacco: Never   Tobacco comments:    10 cigarettes a day  Vaping Use   Vaping Use: Never used  Substance Use Topics   Alcohol use: No    Alcohol/week: 0.0 standard drinks   Drug use: No    Home Medications Prior to Admission medications   Medication Sig Start Date End Date Taking? Authorizing Provider  levETIRAcetam (KEPPRA) 500 MG tablet Take 1 tablet (500 mg total) by mouth 2 (two) times daily. 10/22/20 11/21/20 Yes Demetris Capell S, PA-C  potassium chloride (KLOR-CON) 10 MEQ tablet Take 1 tablet (10 mEq total) by  mouth daily for 5 days. 10/22/20 10/27/20 Yes Eman Rynders S, PA-C  amLODipine (NORVASC) 5 MG tablet Take 1 tablet (5 mg total) by mouth daily. 08/28/20 11/26/20  Fay Records, MD  aspirin 81 MG tablet Take 81 mg by mouth daily.    [provider]  azelastine (ASTELIN) 0.1 % nasal spray Place 1 spray into both nostrils 2 (two) times daily. Use in each nostril as directed    [provider]  cetirizine (ZYRTEC) 10 MG tablet Take 10 mg by mouth daily.    [provider]  citalopram (CELEXA) 20 MG tablet Take 1 tablet (20 mg total) by mouth daily. 09/07/20   [name], MD  clonazePAM (KLONOPIN) 2 MG tablet Take 1 tablet  (2 mg total) by mouth at bedtime. 06/21/20   [name], MD  diclofenac Sodium (VOLTAREN) 1 % GEL Apply topically in the morning and at bedtime.    [provider]  donepezil (ARICEPT) 10 MG tablet Take 1 tablet (10 mg total) by mouth at bedtime. 09/07/20   [name], MD  fluticasone (FLONASE) 50 MCG/ACT nasal spray Place into both nostrils daily.    [provider]  lisinopril (ZESTRIL) 40 MG tablet Take 40 mg by mouth 2 (two) times daily. 07/17/20   [provider]  meloxicam (MOBIC) 15 MG tablet Take 15 mg by mouth daily.    [provider]  metFORMIN (GLUCOPHAGE) 500 MG tablet Take 500 mg by mouth daily.    [provider]  omeprazole (PRILOSEC) 20 MG capsule Take 1 capsule (20 mg total) by mouth daily. 08/31/20   Harvel Quale, MD  pravastatin (PRAVACHOL) 10 MG tablet Take 10 mg by mouth daily.    [provider]  risperiDONE (RISPERDAL) 3 MG tablet Take 1 tablet (3 mg total) by mouth 2 (two) times daily. 09/07/20   [name], MD  traZODone (DESYREL) 100 MG tablet Take 1 tablet (100 mg total) by mouth at bedtime. 09/07/20   [name], MD    Allergies    Patient has no known allergies.  Review of Systems   Review of Systems  Constitutional:  Negative for fever.  HENT:  Negative for ear pain and sore throat.   Eyes:  Negative for visual disturbance.  Respiratory:  Negative for cough and shortness of breath.   Cardiovascular:  Negative for chest pain.  Gastrointestinal:  Negative for abdominal pain, constipation, diarrhea, nausea and vomiting.  Genitourinary:  Negative for dysuria and hematuria.  Musculoskeletal:  Negative for back pain.  Skin:  Negative for color change and rash.  Neurological:  Positive for seizures (possible). Negative for syncope.  All other systems reviewed and are negative.  Physical Exam Updated Vital Signs BP 131/70   Pulse (!) 52   Temp 98 F (36.7 C) (Oral)   Resp 16   Ht '6\' 2"'$   (1.88 m)   Wt 90.8 kg   SpO2 100%   BMI 25.70 kg/m   Physical Exam Vitals and nursing note reviewed.  Constitutional:      Appearance: He is well-developed.  HENT:     Head: Normocephalic and atraumatic.     Mouth/Throat:     Comments: Bite mark to the tip of the tongue. Superficial wound to the right lower lip Eyes:     Extraocular Movements: Extraocular movements intact.     Conjunctiva/sclera: Conjunctivae normal.     Pupils: Pupils are equal, round, and reactive to light.  Cardiovascular:  Rate and Rhythm: Normal rate and regular rhythm.     Heart sounds: No murmur heard. Pulmonary:     Effort: Pulmonary effort is normal. No respiratory distress.     Breath sounds: Normal breath sounds.  Abdominal:     General: Bowel sounds are normal.     Palpations: Abdomen is soft.     Tenderness: There is no abdominal tenderness.  Musculoskeletal:     Cervical back: Neck supple.     Comments: No midline spinal TTP  Skin:    General: Skin is warm and dry.  Neurological:     Mental Status: He is alert.     Comments: Mental Status:  Alert, thought content appropriate, able to give a coherent history. Speech fluent without evidence of aphasia. Able to follow 2 step commands without difficulty.  Cranial Nerves:  II:  pupils equal, round, reactive to light III,IV, VI: ptosis not present, extra-ocular motions intact bilaterally  V,VII: smile symmetric, facial light touch sensation equal VIII: hearing grossly normal to voice  X: uvula elevates symmetrically  XI: bilateral shoulder shrug symmetric and strong XII: midline tongue extension without fassiculations Motor:  Normal tone. 5/5 strength of BUE and BLE major muscle groups including strong and equal grip strength and dorsiflexion/plantar flexion Sensory: light touch normal in all extremities.      ED Results / Procedures / Treatments   Labs (all labs ordered are listed, but only abnormal results are displayed) Labs  Reviewed  CBC WITH DIFFERENTIAL/PLATELET - Abnormal; Notable for the following components:      Result Value   RBC 3.93 (*)    Hemoglobin 11.8 (*)    HCT 37.0 (*)    All other components within normal limits  COMPREHENSIVE METABOLIC PANEL - Abnormal; Notable for the following components:   Potassium 3.2 (*)    Glucose, Bld 137 (*)    BUN 24 (*)    Calcium 8.8 (*)    All other components within normal limits  URINALYSIS, ROUTINE W REFLEX MICROSCOPIC - Abnormal; Notable for the following components:   Ketones, ur 5 (*)    Protein, ur 30 (*)    All other components within normal limits  MAGNESIUM    EKG None  Radiology CT Head Wo Contrast  Result Date: 10/22/2020 CLINICAL DATA:  Head trauma, mod-severe Found on the ground today. EXAM: CT HEAD WITHOUT CONTRAST TECHNIQUE: Contiguous axial images were obtained from the base of the skull through the vertex without intravenous contrast. COMPARISON:  Head CT 08/01/2020 FINDINGS: Brain: No intracranial hemorrhage, mass effect, or midline shift. No hydrocephalus. Cavum septum pellucidum, variant anatomy again seen. The basilar cisterns are patent. 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 No evidence of territorial infarct or acute ischemia. No extra-axial or intracranial fluid collection. Vascular: No hyperdense vessel. Skull: No fracture or focal lesion. Sinuses/Orbits: Paranasal sinuses and mastoid air cells are clear. The visualized orbits are unremarkable. Other: No confluent scalp hematoma. IMPRESSION: No acute intracranial abnormality. No skull fracture. Electronically Signed   By: Keith Rake M.D.   On: 10/22/2020 22:15    Procedures Procedures   Medications Ordered in ED Medications  levETIRAcetam (KEPPRA) IVPB 1000 mg/100 mL premix (0 mg Intravenous Stopped 10/22/20 2248)    ED Course  I have reviewed the triage vital signs and the nursing notes.  Pertinent labs & imaging results that were available during my care of  the patient were reviewed by me and considered in my medical decision making (see chart  for details).    MDM Rules/Calculators/A&P                          60 year old male presenting for evaluation for seizure-like activity that was noted at his home prior to arrival by caretakers.  Had episode of seizures earlier this year as well.  He actually had an EEG on 10/20/2020 that was normal.  He follows with Dr. Merlene Laughter.  He has not been started on any antiepileptics at this time.  Reviewed/interpreted labs CBC shows some very mild anemia, no leukocytosis CMP shows mild hypokalemia, and a mildly elevated BUN Mg unremarkable  Reviewed/interpreted imaging CT head - No acute intracranial abnormality. No skull fracture  9:56 PM CONSULT with Dr. Rory Percy with neurology who recommends loading the patient with Keppra and starting the patient on 500 mg Keppra BID. States that if patient has no signs of infection and continues to be at baseline he would recommend close outpatient follow up with Dr. Merlene Laughter. If pt has signs of infection or any further seizure like activity or changes in mental status then he would recommend admission.   Patient's work-up today is reassuring though he does have some mild hypokalemia.  We will start him and supplemental potassium and advised that he have his labs rechecked.  Additionally we will start him on Keppra and have him follow-up with closely with Dr. Merlene Laughter for reassessment.  Have advised on strict return precautions for new or worsening symptoms.  Patient's caretaker at bedside voices understanding and is in agreement with the plan.  All questions were answered.  Patient stable for discharge.  Final Clinical Impression(s) / ED Diagnoses Final diagnoses:  Seizure-like activity ([place])  Hypokalemia    Rx / DC Orders ED Discharge Orders          Ordered    levETIRAcetam (KEPPRA) 500 MG tablet  2 times daily        10/22/20 2303    potassium chloride (KLOR-CON)  10 MEQ tablet  Daily        10/22/20 2303             Rodney Booze, PA-C 10/22/20 2305    Fredia Sorrow, MD 10/26/20 1254

## 2020-10-26 ENCOUNTER — Ambulatory Visit (HOSPITAL_COMMUNITY)
Admission: RE | Admit: 2020-10-26 | Discharge: 2020-10-26 | Disposition: A | Discharge status: Home / Self Care | Payer: Medicare Other | Source: Ambulatory Visit | Attending: Neurology | Admitting: Neurology

## 2020-10-26 ENCOUNTER — Other Ambulatory Visit: Payer: Self-pay

## 2020-10-26 DIAGNOSIS — R569 Unspecified convulsions: Secondary | ICD-10-CM | POA: Insufficient documentation

## 2020-10-26 MED ORDER — GADOBUTROL 1 MMOL/ML IV SOLN
10.0000 mL | Freq: Once | INTRAVENOUS | Status: AC | PRN
  Administered 2020-10-26: 10 mL via INTRAVENOUS

## 2020-12-21 NOTE — Telephone Encounter (Signed)
CLOSED

## 2021-06-08 ENCOUNTER — Other Ambulatory Visit (INDEPENDENT_AMBULATORY_CARE_PROVIDER_SITE_OTHER): Payer: Self-pay | Admitting: Gastroenterology

## 2021-06-08 DIAGNOSIS — K219 Gastro-esophageal reflux disease without esophagitis: Secondary | ICD-10-CM

## 2021-09-03 ENCOUNTER — Ambulatory Visit (INDEPENDENT_AMBULATORY_CARE_PROVIDER_SITE_OTHER): Payer: Medicare Other | Admitting: Gastroenterology

## 2021-09-03 ENCOUNTER — Encounter (INDEPENDENT_AMBULATORY_CARE_PROVIDER_SITE_OTHER): Payer: Self-pay | Admitting: Gastroenterology

## 2021-09-07 ENCOUNTER — Other Ambulatory Visit (INDEPENDENT_AMBULATORY_CARE_PROVIDER_SITE_OTHER): Payer: Self-pay | Admitting: Gastroenterology

## 2021-09-07 DIAGNOSIS — K219 Gastro-esophageal reflux disease without esophagitis: Secondary | ICD-10-CM

## 2021-09-27 ENCOUNTER — Other Ambulatory Visit (INDEPENDENT_AMBULATORY_CARE_PROVIDER_SITE_OTHER): Payer: Self-pay | Admitting: Gastroenterology

## 2021-09-27 DIAGNOSIS — K219 Gastro-esophageal reflux disease without esophagitis: Secondary | ICD-10-CM

## 2021-10-01 ENCOUNTER — Other Ambulatory Visit (INDEPENDENT_AMBULATORY_CARE_PROVIDER_SITE_OTHER): Payer: Self-pay | Admitting: Gastroenterology

## 2021-10-01 DIAGNOSIS — K219 Gastro-esophageal reflux disease without esophagitis: Secondary | ICD-10-CM

## 2022-02-11 ENCOUNTER — Encounter (INDEPENDENT_AMBULATORY_CARE_PROVIDER_SITE_OTHER): Payer: Self-pay | Admitting: *Deleted

## 2022-07-20 ENCOUNTER — Other Ambulatory Visit: Payer: Self-pay

## 2022-07-20 ENCOUNTER — Encounter (HOSPITAL_COMMUNITY): Payer: Self-pay

## 2022-07-20 ENCOUNTER — Emergency Department (HOSPITAL_COMMUNITY)
Admission: EM | Admit: 2022-07-20 | Discharge: 2022-07-21 | Disposition: A | Discharge status: Nursing Home (Includes ICF) | Payer: Medicare Other | Attending: Emergency Medicine | Admitting: Emergency Medicine

## 2022-07-20 ENCOUNTER — Emergency Department (HOSPITAL_COMMUNITY): Payer: Medicare Other

## 2022-07-20 DIAGNOSIS — S0101XA Laceration without foreign body of scalp, initial encounter: Secondary | ICD-10-CM | POA: Insufficient documentation

## 2022-07-20 DIAGNOSIS — R569 Unspecified convulsions: Secondary | ICD-10-CM | POA: Diagnosis not present

## 2022-07-20 DIAGNOSIS — Z79899 Other long term (current) drug therapy: Secondary | ICD-10-CM | POA: Insufficient documentation

## 2022-07-20 DIAGNOSIS — W19XXXA Unspecified fall, initial encounter: Secondary | ICD-10-CM | POA: Insufficient documentation

## 2022-07-20 DIAGNOSIS — E119 Type 2 diabetes mellitus without complications: Secondary | ICD-10-CM | POA: Insufficient documentation

## 2022-07-20 DIAGNOSIS — E876 Hypokalemia: Secondary | ICD-10-CM | POA: Diagnosis not present

## 2022-07-20 LAB — CBC WITH DIFFERENTIAL/PLATELET
Abs Immature Granulocytes: 0.04 10*3/uL (ref 0.00–0.07)
Basophils Absolute: 0 10*3/uL (ref 0.0–0.1)
Basophils Relative: 1 %
Eosinophils Absolute: 0.1 10*3/uL (ref 0.0–0.5)
Eosinophils Relative: 2 %
HCT: 35.1 % — ABNORMAL LOW (ref 39.0–52.0)
Hemoglobin: 11.8 g/dL — ABNORMAL LOW (ref 13.0–17.0)
Immature Granulocytes: 1 %
Lymphocytes Relative: 14 %
Lymphs Abs: 0.8 10*3/uL (ref 0.7–4.0)
MCH: 30.2 pg (ref 26.0–34.0)
MCHC: 33.6 g/dL (ref 30.0–36.0)
MCV: 89.8 fL (ref 80.0–100.0)
Monocytes Absolute: 0.6 10*3/uL (ref 0.1–1.0)
Monocytes Relative: 11 %
Neutro Abs: 3.8 10*3/uL (ref 1.7–7.7)
Neutrophils Relative %: 71 %
Platelets: 185 10*3/uL (ref 150–400)
RBC: 3.91 MIL/uL — ABNORMAL LOW (ref 4.22–5.81)
RDW: 12.9 % (ref 11.5–15.5)
WBC: 5.3 10*3/uL (ref 4.0–10.5)
nRBC: 0 % (ref 0.0–0.2)

## 2022-07-20 LAB — COMPREHENSIVE METABOLIC PANEL
ALT: 21 U/L (ref 0–44)
AST: 24 U/L (ref 15–41)
Albumin: 3.6 g/dL (ref 3.5–5.0)
Alkaline Phosphatase: 73 U/L (ref 38–126)
Anion gap: 9 (ref 5–15)
BUN: 12 mg/dL (ref 8–23)
CO2: 24 mmol/L (ref 22–32)
Calcium: 8.9 mg/dL (ref 8.9–10.3)
Chloride: 104 mmol/L (ref 98–111)
Creatinine, Ser: 1.05 mg/dL (ref 0.61–1.24)
GFR, Estimated: >60 mL/min (ref >60)
Glucose, Bld: 175 mg/dL — ABNORMAL HIGH (ref 70–99)
Potassium: 3.4 mmol/L — ABNORMAL LOW (ref 3.5–5.1)
Sodium: 137 mmol/L (ref 135–145)
Total Bilirubin: 0.5 mg/dL (ref 0.3–1.2)
Total Protein: 7.1 g/dL (ref 6.5–8.1)

## 2022-07-20 LAB — ETHANOL: Alcohol, Ethyl (B): <10 mg/dL (ref <10)

## 2022-07-20 LAB — LACTIC ACID, PLASMA: Lactic Acid, Venous: 2.2 mmol/L (ref 0.5–1.9)

## 2022-07-20 LAB — CK: Total CK: 366 U/L (ref 49–397)

## 2022-07-20 NOTE — ED Notes (Signed)
Patient transported to CT 
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
## 2022-07-20 NOTE — ED Provider Notes (Signed)
[place] EMERGENCY DEPARTMENT AT [facility] Provider Note   CSN: [id number] Arrival date & time: 07/20/22  2231     History {Add pertinent medical, surgical, social history, OB history to HPI:1} Chief Complaint  Patient presents with   Austin Mcdonald is a 62 y.o. male.  The history is provided by the nursing home. The history is limited by the condition of the patient (Psychiatric condition).  Fall  He has history of diabetes, schizophrenia and was sent from a group home where he had an unwitnessed fall.  He was incontinent of urine and apparently bit his tongue and also suffered a scalp laceration.  He is unable to give any history.   Home Medications Prior to Admission medications   Medication Sig Start Date End Date Taking? Authorizing Provider  amLODipine (NORVASC) 5 MG tablet Take 1 tablet (5 mg total) by mouth daily. 08/28/20 11/26/20  Pricilla Riffle, MD  aspirin 81 MG tablet Take 81 mg by mouth daily.    [provider]  azelastine (ASTELIN) 0.1 % nasal spray Place 1 spray into both nostrils 2 (two) times daily. Use in each nostril as directed    [provider]  cetirizine (ZYRTEC) 10 MG tablet Take 10 mg by mouth daily.    [provider]  citalopram (CELEXA) 20 MG tablet Take 1 tablet (20 mg total) by mouth daily. 09/07/20   Neysa Hotter, MD  clonazePAM (KLONOPIN) 2 MG tablet Take 1 tablet (2 mg total) by mouth at bedtime. 06/21/20   Neysa Hotter, MD  diclofenac Sodium (VOLTAREN) 1 % GEL Apply topically in the morning and at bedtime.    [provider]  donepezil (ARICEPT) 10 MG tablet Take 1 tablet (10 mg total) by mouth at bedtime. 09/07/20   Neysa Hotter, MD  fluticasone (FLONASE) 50 MCG/ACT nasal spray Place into both nostrils daily.    [provider]  levETIRAcetam (KEPPRA) 500 MG tablet Take 1 tablet (500 mg total) by mouth 2 (two) times daily. 10/22/20 11/21/20  Couture, Cortni S, PA-C  lisinopril  (ZESTRIL) 40 MG tablet Take 40 mg by mouth 2 (two) times daily. 07/17/20   [provider]  meloxicam (MOBIC) 15 MG tablet Take 15 mg by mouth daily.    [provider]  metFORMIN (GLUCOPHAGE) 500 MG tablet Take 500 mg by mouth daily.    [provider]  omeprazole (PRILOSEC) 20 MG capsule TAKE (1) CAPSULE BY MOUTH ONCEDAILY. 06/11/21   Dolores Frame, MD  potassium chloride (KLOR-CON) 10 MEQ tablet Take 1 tablet (10 mEq total) by mouth daily for 5 days. 10/22/20 10/27/20  Couture, Cortni S, PA-C  pravastatin (PRAVACHOL) 10 MG tablet Take 10 mg by mouth daily.    [provider]  risperiDONE (RISPERDAL) 3 MG tablet Take 1 tablet (3 mg total) by mouth 2 (two) times daily. 09/07/20   Neysa Hotter, MD  traZODone (DESYREL) 100 MG tablet Take 1 tablet (100 mg total) by mouth at bedtime. 09/07/20   Neysa Hotter, MD      Allergies    Bee pollen    Review of Systems   Review of Systems  Unable to perform ROS: Psychiatric disorder    Physical Exam Updated Vital Signs BP 123/65   Pulse 73   Resp 17   Ht 6\' 2"  (1.88 m)   Wt 90.8 kg   SpO2 92%   BMI 25.70 kg/m  Physical Exam Vitals and nursing note  reviewed.   62 year old male, resting comfortably and in no acute distress. Vital signs are normal. Oxygen saturation is 92%, which is normal. Head is normocephalic.  Laceration is present at the vertex on the left side. PERRLA, EOMI. Oropharynx is clear.  Bite mark noted on the left lateral aspect of the tongue. Neck is nontender. Lungs are clear without rales, wheezes, or rhonchi. Chest is nontender. Heart has regular rate and rhythm without murmur. Abdomen is soft, flat, nontender. Extremities have no cyanosis or edema, full range of motion is present. Skin is warm and dry without rash. Neurologic: Awake and alert and oriented to person but not place or time.  Speech is slow and mildly dysarthric but apparently at his baseline.  He moves all  extremities equally.  ED Results / Procedures / Treatments   Labs (all labs ordered are listed, but only abnormal results are displayed) Labs Reviewed - No data to display  EKG None  Radiology No results found.  Procedures Procedures  Cardiac monitor shows sinus tachycardia, per my interpretation.  Medications Ordered in ED Medications - No data to display  ED Course/ Medical Decision Making/ A&P   {   Click here for ABCD2, HEART and other calculatorsREFRESH Note before signing :1}                          Medical Decision Making Amount and/or Complexity of Data Reviewed Labs: ordered. Radiology: ordered.   Fall with head injury and scalp laceration.  With urinary incontinence and bedtime, concern for possible seizure.  I have ordered CT of the head and cervical spine I have ordered laboratory workup of CBC, competence of metabolic panel, CK, lactic acid level, urinalysis, urine drug screen, ethanol level.  I reviewed his past records, and he has 2 ED visits for seizure-like activity-08/01/2020 and 10/22/2020.  At the second ED visit, he was given a prescription for levetiracetam.  He had been referred to neurology for outpatient evaluation.    Caregiver from group home has arrived and but his medication list.  He is on levetiracetam 750 mg twice a day.  With apparent breakthrough seizure, I will give him additional levetiracetam intravenously and plan to increase his levetiracetam dose.  {Document critical care time when appropriate:1} {Document review of labs and clinical decision tools ie heart score, Chads2Vasc2 etc:1}  {Document your independent review of radiology images, and any outside records:1} {Document your discussion with family members, caretakers, and with consultants:1} {Document social determinants of health affecting pt's care:1} {Document your decision making why or why not admission, treatments were needed:1} Final Clinical Impression(s) / ED  Diagnoses Final diagnoses:  None    Rx / DC Orders ED Discharge Orders     None

## 2022-07-21 DIAGNOSIS — S0101XA Laceration without foreign body of scalp, initial encounter: Secondary | ICD-10-CM | POA: Diagnosis not present

## 2022-07-21 LAB — URINALYSIS, W/ REFLEX TO CULTURE (INFECTION SUSPECTED)
Bacteria, UA: NONE SEEN
Bilirubin Urine: NEGATIVE
Glucose, UA: NEGATIVE mg/dL
Hgb urine dipstick: NEGATIVE
Ketones, ur: NEGATIVE mg/dL
Leukocytes,Ua: NEGATIVE
Nitrite: NEGATIVE
Protein, ur: NEGATIVE mg/dL
Specific Gravity, Urine: 1.005 (ref 1.005–1.030)
pH: 6 (ref 5.0–8.0)

## 2022-07-21 LAB — RAPID URINE DRUG SCREEN, HOSP PERFORMED
Amphetamines: NOT DETECTED
Barbiturates: NOT DETECTED
Benzodiazepines: NOT DETECTED
Cocaine: NOT DETECTED
Opiates: NOT DETECTED
Tetrahydrocannabinol: NOT DETECTED

## 2022-07-21 MED ORDER — CLOPIDOGREL BISULFATE 75 MG PO TABS: 300.0000 mg | ORAL_TABLET | Freq: Once | ORAL | Status: DC

## 2022-07-21 MED ORDER — POTASSIUM CHLORIDE CRYS ER 20 MEQ PO TBCR
40.0000 meq | EXTENDED_RELEASE_TABLET | Freq: Once | ORAL | Status: AC
  Administered 2022-07-21: 40 meq via ORAL
  Filled 2022-07-21: qty 2

## 2022-07-21 MED ORDER — LEVETIRACETAM 1000 MG PO TABS: 1000.0000 mg | ORAL_TABLET | Freq: Two times a day (BID) | ORAL | 3 refills | Status: AC

## 2022-07-21 MED ORDER — LEVETIRACETAM 1000 MG PO TABS: 1000.0000 mg | ORAL_TABLET | Freq: Two times a day (BID) | ORAL | 3 refills | Status: DC

## 2022-07-21 MED ORDER — LEVETIRACETAM IN NACL 1000 MG/100ML IV SOLN
1000.0000 mg | Freq: Once | INTRAVENOUS | Status: AC
  Administered 2022-07-21: 1000 mg via INTRAVENOUS
  Filled 2022-07-21: qty 100

## 2022-07-21 MED ORDER — POTASSIUM CHLORIDE CRYS ER 20 MEQ PO TBCR: 20.0000 meq | EXTENDED_RELEASE_TABLET | Freq: Two times a day (BID) | ORAL | 0 refills | Status: DC

## 2022-07-21 NOTE — Discharge Instructions (Addendum)
Please change the dose of levetiracetam (Keppra).  The new dose is 1000 mg twice a day.  This means that she will have to stop taking the 750 mg tablets.  I am giving a new prescription for 1000 mg tablets.

## 2022-10-05 ENCOUNTER — Other Ambulatory Visit: Payer: Self-pay

## 2022-10-05 ENCOUNTER — Encounter (HOSPITAL_COMMUNITY): Payer: Self-pay

## 2022-10-05 ENCOUNTER — Emergency Department (HOSPITAL_COMMUNITY)
Admission: EM | Admit: 2022-10-05 | Discharge: 2022-10-05 | Disposition: A | Discharge status: Home / Self Care | Payer: Medicare Other | Attending: Emergency Medicine | Admitting: Emergency Medicine

## 2022-10-05 DIAGNOSIS — Z7982 Long term (current) use of aspirin: Secondary | ICD-10-CM | POA: Diagnosis not present

## 2022-10-05 DIAGNOSIS — R21 Rash and other nonspecific skin eruption: Secondary | ICD-10-CM | POA: Diagnosis not present

## 2022-10-05 DIAGNOSIS — M7989 Other specified soft tissue disorders: Secondary | ICD-10-CM | POA: Diagnosis present

## 2022-10-05 DIAGNOSIS — E119 Type 2 diabetes mellitus without complications: Secondary | ICD-10-CM | POA: Insufficient documentation

## 2022-10-05 DIAGNOSIS — Z7984 Long term (current) use of oral hypoglycemic drugs: Secondary | ICD-10-CM | POA: Diagnosis not present

## 2022-10-05 HISTORY — DX: Drug induced subacute dyskinesia: G24.01

## 2022-10-05 HISTORY — DX: Restlessness and agitation: R45.1

## 2022-10-05 HISTORY — DX: Chronic rhinitis: J31.0

## 2022-10-05 HISTORY — DX: Essential (primary) hypertension: I10

## 2022-10-05 HISTORY — DX: Disturbances of salivary secretion: K11.7

## 2022-10-05 HISTORY — DX: Unspecified dementia, unspecified severity, without behavioral disturbance, psychotic disturbance, mood disturbance, and anxiety: F03.90

## 2022-10-05 HISTORY — DX: Hyperlipidemia, unspecified: E78.5

## 2022-10-05 LAB — CBC WITH DIFFERENTIAL/PLATELET
Abs Immature Granulocytes: 0.03 10*3/uL (ref 0.00–0.07)
Basophils Absolute: 0 10*3/uL (ref 0.0–0.1)
Basophils Relative: 0 %
Eosinophils Absolute: 0.2 10*3/uL (ref 0.0–0.5)
Eosinophils Relative: 3 %
HCT: 37.4 % — ABNORMAL LOW (ref 39.0–52.0)
Hemoglobin: 11.9 g/dL — ABNORMAL LOW (ref 13.0–17.0)
Immature Granulocytes: 1 %
Lymphocytes Relative: 16 %
Lymphs Abs: 0.9 10*3/uL (ref 0.7–4.0)
MCH: 29.5 pg (ref 26.0–34.0)
MCHC: 31.8 g/dL (ref 30.0–36.0)
MCV: 92.8 fL (ref 80.0–100.0)
Monocytes Absolute: 0.6 10*3/uL (ref 0.1–1.0)
Monocytes Relative: 10 %
Neutro Abs: 4.1 10*3/uL (ref 1.7–7.7)
Neutrophils Relative %: 70 %
Platelets: 181 10*3/uL (ref 150–400)
RBC: 4.03 MIL/uL — ABNORMAL LOW (ref 4.22–5.81)
RDW: 13.1 % (ref 11.5–15.5)
WBC: 5.8 10*3/uL (ref 4.0–10.5)
nRBC: 0 % (ref 0.0–0.2)

## 2022-10-05 LAB — COMPREHENSIVE METABOLIC PANEL
ALT: 18 U/L (ref 0–44)
AST: 22 U/L (ref 15–41)
Albumin: 3.5 g/dL (ref 3.5–5.0)
Alkaline Phosphatase: 79 U/L (ref 38–126)
Anion gap: 7 (ref 5–15)
BUN: 15 mg/dL (ref 8–23)
CO2: 26 mmol/L (ref 22–32)
Calcium: 9 mg/dL (ref 8.9–10.3)
Chloride: 102 mmol/L (ref 98–111)
Creatinine, Ser: 1.13 mg/dL (ref 0.61–1.24)
GFR, Estimated: >60 mL/min (ref >60)
Glucose, Bld: 198 mg/dL — ABNORMAL HIGH (ref 70–99)
Potassium: 4.4 mmol/L (ref 3.5–5.1)
Sodium: 135 mmol/L (ref 135–145)
Total Bilirubin: 0.2 mg/dL — ABNORMAL LOW (ref 0.3–1.2)
Total Protein: 7.1 g/dL (ref 6.5–8.1)

## 2022-10-05 MED ORDER — DOXYCYCLINE HYCLATE 100 MG PO CAPS: 100.0000 mg | ORAL_CAPSULE | Freq: Two times a day (BID) | ORAL | 0 refills | Status: DC

## 2022-10-05 NOTE — ED Provider Notes (Signed)
EMERGENCY DEPARTMENT AT [facility] Provider Note   CSN: [id number] Arrival date & time: 10/05/22  1037     History  Chief Complaint  Patient presents with   Leg Swelling   wounds    Austin Mcdonald is a 62 y.o. male.  Patient has a history of diabetes.  He complains of 2 sores to his right ankle  The history is provided by the patient and medical records. No language interpreter was used.  Rash Location: Right ankle. Quality: blistering   Severity:  Mild Onset quality:  Sudden Timing:  Constant Progression:  Worsening Chronicity:  New Context: not animal contact   Relieved by:  Nothing Associated symptoms: no abdominal pain, no diarrhea, no fatigue and no headaches        Home Medications Prior to Admission medications   Medication Sig Start Date End Date Taking? Authorizing Provider  doxycycline (VIBRAMYCIN) 100 MG capsule Take 1 capsule (100 mg total) by mouth 2 (two) times daily. One po bid x 7 days 10/05/22  Yes Bethann Berkshire, MD  amLODipine (NORVASC) 5 MG tablet Take 1 tablet (5 mg total) by mouth daily. 08/28/20 11/26/20  Pricilla Riffle, MD  aspirin 81 MG tablet Take 81 mg by mouth daily.    [provider]  azelastine (ASTELIN) 0.1 % nasal spray Place 1 spray into both nostrils 2 (two) times daily. Use in each nostril as directed    [provider]  cetirizine (ZYRTEC) 10 MG tablet Take 10 mg by mouth daily.    [provider]  citalopram (CELEXA) 20 MG tablet Take 1 tablet (20 mg total) by mouth daily. 09/07/20   Neysa Hotter, MD  clonazePAM (KLONOPIN) 2 MG tablet Take 1 tablet (2 mg total) by mouth at bedtime. 06/21/20   Neysa Hotter, MD  diclofenac Sodium (VOLTAREN) 1 % GEL Apply topically in the morning and at bedtime.    [provider]  donepezil (ARICEPT) 10 MG tablet Take 1 tablet (10 mg total) by mouth at bedtime. 09/07/20   Neysa Hotter, MD  fluticasone (FLONASE) 50 MCG/ACT nasal spray Place into  both nostrils daily.    [provider]  levETIRAcetam (KEPPRA) 1000 MG tablet Take 1 tablet (1,000 mg total) by mouth 2 (two) times daily. 07/21/22   Dione Booze, MD  lisinopril (ZESTRIL) 40 MG tablet Take 40 mg by mouth 2 (two) times daily. 07/17/20   [provider]  meloxicam (MOBIC) 15 MG tablet Take 15 mg by mouth daily.    [provider]  metFORMIN (GLUCOPHAGE) 500 MG tablet Take 500 mg by mouth daily.    [provider]  omeprazole (PRILOSEC) 20 MG capsule TAKE (1) CAPSULE BY MOUTH ONCEDAILY. 06/11/21   Dolores Frame, MD  potassium chloride (KLOR-CON) 10 MEQ tablet Take 1 tablet (10 mEq total) by mouth daily for 5 days. 10/22/20 10/27/20  Couture, Cortni S, PA-C  potassium chloride SA (KLOR-CON M) 20 MEQ tablet Take 1 tablet (20 mEq total) by mouth 2 (two) times daily. 07/21/22   Dione Booze, MD  pravastatin (PRAVACHOL) 10 MG tablet Take 10 mg by mouth daily.    [provider]  risperiDONE (RISPERDAL) 3 MG tablet Take 1 tablet (3 mg total) by mouth 2 (two) times daily. 09/07/20   Neysa Hotter, MD  traZODone (DESYREL) 100 MG tablet Take 1 tablet (100 mg total) by mouth at bedtime. 09/07/20   Neysa Hotter, MD      Allergies  Bee pollen    Review of Systems   Review of Systems  Constitutional:  Negative for appetite change and fatigue.  HENT:  Negative for congestion, ear discharge and sinus pressure.   Eyes:  Negative for discharge.  Respiratory:  Negative for cough.   Cardiovascular:  Negative for chest pain.  Gastrointestinal:  Negative for abdominal pain and diarrhea.  Genitourinary:  Negative for frequency and hematuria.  Musculoskeletal:  Negative for back pain.  Skin:  Positive for rash.  Neurological:  Negative for seizures and headaches.  Psychiatric/Behavioral:  Negative for hallucinations.     Physical Exam Updated Vital Signs BP 123/69   Pulse 86   Temp 97.7 F (36.5 C) (Oral)   Resp 17   Ht 6\' 2"  (1.88 m)    Wt 99.8 kg   SpO2 94%   BMI 28.25 kg/m  Physical Exam Vitals and nursing note reviewed.  Constitutional:      Appearance: He is well-developed.  HENT:     Head: Normocephalic.     Nose: Nose normal.  Eyes:     General: No scleral icterus.    Conjunctiva/sclera: Conjunctivae normal.  Neck:     Thyroid: No thyromegaly.  Cardiovascular:     Rate and Rhythm: Normal rate and regular rhythm.     Heart sounds: No murmur heard.    No friction rub. No gallop.  Pulmonary:     Breath sounds: No stridor. No wheezing or rales.  Chest:     Chest wall: No tenderness.  Abdominal:     General: There is no distension.     Tenderness: There is no abdominal tenderness. There is no rebound.  Musculoskeletal:        General: Normal range of motion.     Cervical back: Neck supple.  Lymphadenopathy:     Cervical: No cervical adenopathy.  Skin:    Findings: No erythema or rash.     Comments: Tube small source of right ankle  Neurological:     Mental Status: He is alert and oriented to person, place, and time.     Motor: No abnormal muscle tone.     Coordination: Coordination normal.  Psychiatric:        Behavior: Behavior normal.     ED Results / Procedures / Treatments   Labs (all labs ordered are listed, but only abnormal results are displayed) Labs Reviewed  CBC WITH DIFFERENTIAL/PLATELET - Abnormal; Notable for the following components:      Result Value   RBC 4.03 (*)    Hemoglobin 11.9 (*)    HCT 37.4 (*)    All other components within normal limits  COMPREHENSIVE METABOLIC PANEL - Abnormal; Notable for the following components:   Glucose, Bld 198 (*)    Total Bilirubin 0.2 (*)    All other components within normal limits    EKG None  Radiology No results found.  Procedures Procedures    Medications Ordered in ED Medications - No data to display  ED Course/ Medical Decision Making/ A&P                             Medical Decision Making Amount and/or  Complexity of Data Reviewed Labs: ordered.  Risk Prescription drug management.   Patient with ulcers to right ankle and possible cellulitis.  He will be placed on doxycycline and follow-up with his PCP        Final Clinical Impression(s) /  ED Diagnoses Final diagnoses:  Leg swelling    Rx / DC Orders ED Discharge Orders          Ordered    doxycycline (VIBRAMYCIN) 100 MG capsule  2 times daily        10/05/22 1426              Bethann Berkshire, MD 10/07/22 1725

## 2022-10-05 NOTE — ED Triage Notes (Signed)
Pt c/o bilateral lower legs and feet swelling x 4 days. Pt laso has wounds on his right lower leg and foot x 3 days. Pt is diabetic.

## 2022-10-05 NOTE — ED Notes (Signed)
Pt lives at [facility] in [place]. Staff member brought pt to ED for eval.

## 2022-10-05 NOTE — ED Notes (Signed)
Dressing applied. 

## 2022-10-05 NOTE — Discharge Instructions (Signed)
Take Tylenol for pain.  Clean your sores on your leg twice a day with soap and water and then apply dressing.  Follow-up with your family doctor in the next 3 to 4 days for recheck

## 2022-12-06 ENCOUNTER — Other Ambulatory Visit: Payer: Self-pay

## 2022-12-06 ENCOUNTER — Encounter (HOSPITAL_COMMUNITY): Payer: Self-pay

## 2022-12-06 ENCOUNTER — Emergency Department (HOSPITAL_COMMUNITY)
Admission: EM | Admit: 2022-12-06 | Discharge: 2022-12-06 | Disposition: A | Discharge status: Home / Self Care | Payer: Medicare Other | Attending: Emergency Medicine | Admitting: Emergency Medicine

## 2022-12-06 DIAGNOSIS — Z7984 Long term (current) use of oral hypoglycemic drugs: Secondary | ICD-10-CM | POA: Insufficient documentation

## 2022-12-06 DIAGNOSIS — E1165 Type 2 diabetes mellitus with hyperglycemia: Secondary | ICD-10-CM | POA: Insufficient documentation

## 2022-12-06 DIAGNOSIS — Z79899 Other long term (current) drug therapy: Secondary | ICD-10-CM | POA: Diagnosis not present

## 2022-12-06 DIAGNOSIS — I10 Essential (primary) hypertension: Secondary | ICD-10-CM | POA: Insufficient documentation

## 2022-12-06 DIAGNOSIS — R739 Hyperglycemia, unspecified: Secondary | ICD-10-CM | POA: Diagnosis present

## 2022-12-06 DIAGNOSIS — Z7982 Long term (current) use of aspirin: Secondary | ICD-10-CM | POA: Diagnosis not present

## 2022-12-06 LAB — CBC
HCT: 37.4 % — ABNORMAL LOW (ref 39.0–52.0)
Hemoglobin: 12.4 g/dL — ABNORMAL LOW (ref 13.0–17.0)
MCH: 29.5 pg (ref 26.0–34.0)
MCHC: 33.2 g/dL (ref 30.0–36.0)
MCV: 88.8 fL (ref 80.0–100.0)
Platelets: 196 10*3/uL (ref 150–400)
RBC: 4.21 MIL/uL — ABNORMAL LOW (ref 4.22–5.81)
RDW: 13.2 % (ref 11.5–15.5)
WBC: 6.4 10*3/uL (ref 4.0–10.5)
nRBC: 0 % (ref 0.0–0.2)

## 2022-12-06 LAB — URINALYSIS, ROUTINE W REFLEX MICROSCOPIC
Bacteria, UA: NONE SEEN
Bilirubin Urine: NEGATIVE
Glucose, UA: >=500 mg/dL — AB
Hgb urine dipstick: NEGATIVE
Ketones, ur: NEGATIVE mg/dL
Leukocytes,Ua: NEGATIVE
Nitrite: NEGATIVE
Protein, ur: NEGATIVE mg/dL
Specific Gravity, Urine: 1.006 (ref 1.005–1.030)
pH: 6 (ref 5.0–8.0)

## 2022-12-06 LAB — BASIC METABOLIC PANEL
Anion gap: 9 (ref 5–15)
BUN: 18 mg/dL (ref 8–23)
CO2: 26 mmol/L (ref 22–32)
Calcium: 9.4 mg/dL (ref 8.9–10.3)
Chloride: 94 mmol/L — ABNORMAL LOW (ref 98–111)
Creatinine, Ser: 1.28 mg/dL — ABNORMAL HIGH (ref 0.61–1.24)
GFR, Estimated: >60 mL/min (ref >60)
Glucose, Bld: 369 mg/dL — ABNORMAL HIGH (ref 70–99)
Potassium: 4.6 mmol/L (ref 3.5–5.1)
Sodium: 129 mmol/L — ABNORMAL LOW (ref 135–145)

## 2022-12-06 LAB — CBG MONITORING, ED
Glucose-Capillary: 386 mg/dL — ABNORMAL HIGH (ref 70–99)
Glucose-Capillary: 312 mg/dL — ABNORMAL HIGH (ref 70–99)

## 2022-12-06 MED ORDER — SODIUM CHLORIDE 0.9 % IV BOLUS
1000.0000 mL | Freq: Once | INTRAVENOUS | Status: AC
  Administered 2022-12-06: 1000 mL via INTRAVENOUS

## 2022-12-06 NOTE — ED Provider Notes (Signed)
[place] EMERGENCY DEPARTMENT AT [facility] Provider Note   CSN: [id number] Arrival date & time: 12/06/22  1824     History {Add pertinent medical, surgical, social history, OB history to HPI:1} Chief Complaint  Patient presents with   Hyperglycemia    Austin Mcdonald is a 62 y.o. male.  Patient has a history of diabetes and his sugars been running high.  His doctor told him to get evaluated here..  Patient has a history of hypertension diabetes and schizophrenia   Hyperglycemia      Home Medications Prior to Admission medications   Medication Sig Start Date End Date Taking? Authorizing Provider  albuterol (VENTOLIN HFA) 108 (90 Base) MCG/ACT inhaler Inhale 2 puffs into the lungs every 4 (four) hours as needed for wheezing or shortness of breath.   Yes [provider]  amLODipine (NORVASC) 5 MG tablet Take 1 tablet (5 mg total) by mouth daily. 08/28/20 12/06/22 Yes Pricilla Riffle, MD  aspirin 81 MG tablet Take 81 mg by mouth daily.   Yes [provider]  atropine 1 % ophthalmic solution Place 1 drop into both eyes 3 (three) times daily.   Yes [provider]  AUSTEDO 12 MG TABS Take 1 tablet by mouth 2 (two) times daily.   Yes [provider]  cetirizine (ZYRTEC) 10 MG tablet Take 10 mg by mouth daily.   Yes [provider]  clonazePAM (KLONOPIN) 1 MG disintegrating tablet Take 1 mg by mouth 2 (two) times daily. 11/29/22  Yes [provider]  donepezil (ARICEPT) 10 MG tablet Take 1 tablet (10 mg total) by mouth at bedtime. 09/07/20  Yes Neysa Hotter, MD  levETIRAcetam (KEPPRA) 1000 MG tablet Take 1 tablet (1,000 mg total) by mouth 2 (two) times daily. 07/21/22  Yes Dione Booze, MD  lisinopril (ZESTRIL) 40 MG tablet Take 40 mg by mouth 2 (two) times daily. 07/17/20  Yes [provider]  meloxicam (MOBIC) 15 MG tablet Take 15 mg by mouth daily.   Yes [provider]  metFORMIN (GLUCOPHAGE) 500 MG tablet  Take 500 mg by mouth daily.   Yes [provider]  omeprazole (PRILOSEC) 20 MG capsule TAKE (1) CAPSULE BY MOUTH ONCEDAILY. 06/11/21  Yes Dolores Frame, MD  pravastatin (PRAVACHOL) 10 MG tablet Take 10 mg by mouth daily.   Yes [provider]  risperiDONE (RISPERDAL) 3 MG tablet Take 1 tablet (3 mg total) by mouth 2 (two) times daily. 09/07/20  Yes Neysa Hotter, MD  traZODone (DESYREL) 100 MG tablet Take 1 tablet (100 mg total) by mouth at bedtime. 09/07/20  Yes Neysa Hotter, MD      Allergies    Bee pollen    Review of Systems   Review of Systems  Physical Exam Updated Vital Signs BP 123/81 (BP Location: Right Arm)   Pulse 91   Temp 98.8 F (37.1 C) (Oral)   Resp 16   Wt 99 kg   SpO2 98%   BMI 28.02 kg/m  Physical Exam  ED Results / Procedures / Treatments   Labs (all labs ordered are listed, but only abnormal results are displayed) Labs Reviewed  BASIC METABOLIC PANEL - Abnormal; Notable for the following components:      Result Value   Sodium 129 (*)    Chloride 94 (*)    Glucose, Bld 369 (*)    Creatinine, Ser 1.28 (*)    All other components within normal limits  CBC - Abnormal;  Notable for the following components:   RBC 4.21 (*)    Hemoglobin 12.4 (*)    HCT 37.4 (*)    All other components within normal limits  URINALYSIS, ROUTINE W REFLEX MICROSCOPIC - Abnormal; Notable for the following components:   Color, Urine STRAW (*)    Glucose, UA >=500 (*)    All other components within normal limits  CBG MONITORING, ED - Abnormal; Notable for the following components:   Glucose-Capillary 386 (*)    All other components within normal limits  CBG MONITORING, ED - Abnormal; Notable for the following components:   Glucose-Capillary 312 (*)    All other components within normal limits    EKG None  Radiology No results found.  Procedures Procedures  {Document cardiac monitor, telemetry assessment procedure when  appropriate:1}  Medications Ordered in ED Medications  sodium chloride 0.9 % bolus 1,000 mL (0 mLs Intravenous Stopped 12/06/22 2128)    ED Course/ Medical Decision Making/ A&P  Patient has hyperglycemia.  He improved with fluids.  We will increase his metformin so he is taking 1000 mg a day {   Click here for ABCD2, HEART and other calculatorsREFRESH Note before signing :1}                              Medical Decision Making Amount and/or Complexity of Data Reviewed Labs: ordered.  Hyperglycemia.  Patient will increase his metformin and follow-up with his PCP  {Document critical care time when appropriate:1} {Document review of labs and clinical decision tools ie heart score, Chads2Vasc2 etc:1}  {Document your independent review of radiology images, and any outside records:1} {Document your discussion with family members, caretakers, and with consultants:1} {Document social determinants of health affecting pt's care:1} {Document your decision making why or why not admission, treatments were needed:1} Final Clinical Impression(s) / ED Diagnoses Final diagnoses:  Hyperglycemia    Rx / DC Orders ED Discharge Orders     None

## 2022-12-06 NOTE — Discharge Instructions (Signed)
Increase your metformin so you are taking 2 pills once a day instead of 1.  That will equal 1000 mg a day.  Follow-up with your doctor next week

## 2022-12-06 NOTE — ED Triage Notes (Signed)
Went to routine blood work visit with pcp yesterday and referred to ED due to cbg >500 and hyponatremia.  Hx DM and on metformin.

## 2022-12-24 ENCOUNTER — Encounter (INDEPENDENT_AMBULATORY_CARE_PROVIDER_SITE_OTHER): Payer: Self-pay | Admitting: Vascular Surgery

## 2022-12-24 ENCOUNTER — Ambulatory Visit (INDEPENDENT_AMBULATORY_CARE_PROVIDER_SITE_OTHER): Payer: Medicare Other | Admitting: Vascular Surgery

## 2022-12-24 ENCOUNTER — Telehealth (INDEPENDENT_AMBULATORY_CARE_PROVIDER_SITE_OTHER): Payer: Self-pay

## 2022-12-24 VITALS — BP 100/62 | HR 102 | Resp 18 | Ht 76.0 in | Wt 217.4 lb

## 2022-12-24 DIAGNOSIS — E785 Hyperlipidemia, unspecified: Secondary | ICD-10-CM | POA: Diagnosis not present

## 2022-12-24 DIAGNOSIS — I7025 Atherosclerosis of native arteries of other extremities with ulceration: Secondary | ICD-10-CM | POA: Diagnosis not present

## 2022-12-24 DIAGNOSIS — I10 Essential (primary) hypertension: Secondary | ICD-10-CM | POA: Insufficient documentation

## 2022-12-24 DIAGNOSIS — E11621 Type 2 diabetes mellitus with foot ulcer: Secondary | ICD-10-CM | POA: Diagnosis not present

## 2022-12-24 DIAGNOSIS — L97509 Non-pressure chronic ulcer of other part of unspecified foot with unspecified severity: Secondary | ICD-10-CM

## 2022-12-24 NOTE — Assessment & Plan Note (Signed)
blood pressure control important in reducing the progression of atherosclerotic disease. On appropriate oral medications.  

## 2022-12-24 NOTE — Assessment & Plan Note (Signed)
blood glucose control important in reducing the progression of atherosclerotic disease. Also, involved in wound healing. On appropriate medications.  

## 2022-12-24 NOTE — Telephone Encounter (Signed)
Patient was seen in office and scheduled for BLE angio's with Dr. Wyn Quaker left on 12/26/22 and right on 01/02/23 at the [facility]. Pre-procedure instructions were discussed and handed to the caregiver.

## 2022-12-24 NOTE — H&P (View-Only) (Signed)
Patient ID: Austin Mcdonald, male   DOB: Nov 24, 1960, 62 y.o.   MRN: [id number]  Chief Complaint  Patient presents with   New Patient (Initial Visit)    NP. consult. BLE edema/ulcer. hatchet    HPI Austin Mcdonald is a 62 y.o. male.  I am asked to see the patient by Austin Mcdonald for evaluation of non-healing ulcerations of the lower extremities.  Patient has an extensive psychiatric history and lives in a group home so he really cannot provide much of the history.  The group home worker does help and try to provide some of the history.  The patient continues to smoke.  He has had ulcerations on both feet and ankles.  These are painful to him.  The 1 on the right leg is in the right lower leg laterally and has fair granulation tissue.  The left foot on the base of the second and third toes is about a 2 cm ulceration with no granulation tissue which looks pale.  Both feet are dark with trophic changes.  Again, he really cannot provide much history for me.   Past Medical History:  Diagnosis Date   Agitation    Dementia (HCC)    Depression    Diabetes (HCC)    Disturbance of salivary secretion    GERD (gastroesophageal reflux disease) 05/09/2016   Hyperlipidemia    Hypertension    Restless    Rhinitis    Schizophrenic disorder (HCC)    Tardive dyskinesia     Past Surgical History:  Procedure Laterality Date   APPENDECTOMY     COLONOSCOPY N/A 02/23/2015   Procedure: COLONOSCOPY;  Surgeon: Malissa Hippo, MD;  [facility];  Service: Endoscopy;  Laterality: N/A;   ESOPHAGEAL DILATION N/A 02/23/2015   Procedure: ESOPHAGEAL DILATION;  Surgeon: Malissa Hippo, MD;  [facility];  Service: Endoscopy;  Laterality: N/A;   ESOPHAGOGASTRODUODENOSCOPY N/A 02/23/2015   Procedure: ESOPHAGOGASTRODUODENOSCOPY (EGD);  Surgeon: Malissa Hippo, MD;  Location: [facility];  Service: Endoscopy;  Laterality: N/A;  2:00     History reviewed. No pertinent family  history. Unable to obtain from the patient. He is a poor historian   Social History   Tobacco Use   Smoking status: Every Day    Current packs/day: 0.50    Types: Cigarettes   Smokeless tobacco: Never   Tobacco comments:    10 cigarettes a day  Vaping Use   Vaping status: Never Used  Substance Use Topics   Alcohol use: No    Alcohol/week: 0.0 standard drinks of alcohol   Drug use: No     Allergies  Allergen Reactions   Bee Pollen Other (See Comments)    Current Outpatient Medications  Medication Sig Dispense Refill   albuterol (VENTOLIN HFA) 108 (90 Base) MCG/ACT inhaler Inhale 2 puffs into the lungs every 4 (four) hours as needed for wheezing or shortness of breath.     amLODipine (NORVASC) 5 MG tablet Take 1 tablet (5 mg total) by mouth daily. 90 tablet 3   aspirin 81 MG tablet Take 81 mg by mouth daily.     atropine 1 % ophthalmic solution Place 1 drop into both eyes 3 (three) times daily.     AUSTEDO 12 MG TABS Take 1 tablet by mouth 2 (two) times daily.     cetirizine (ZYRTEC) 10 MG tablet Take 10 mg by mouth daily.     clonazePAM (KLONOPIN) 1 MG disintegrating  tablet Take 1 mg by mouth 2 (two) times daily.     donepezil (ARICEPT) 10 MG tablet Take 1 tablet (10 mg total) by mouth at bedtime. 30 tablet 3   levETIRAcetam (KEPPRA) 1000 MG tablet Take 1 tablet (1,000 mg total) by mouth 2 (two) times daily. 60 tablet 3   lisinopril (ZESTRIL) 40 MG tablet Take 40 mg by mouth 2 (two) times daily.     meloxicam (MOBIC) 15 MG tablet Take 15 mg by mouth daily.     metFORMIN (GLUCOPHAGE) 500 MG tablet Take 500 mg by mouth at bedtime. 1,000 mg Q breakfast 500 nightly     omeprazole (PRILOSEC) 20 MG capsule TAKE (1) CAPSULE BY MOUTH ONCEDAILY. 30 capsule 2   pravastatin (PRAVACHOL) 10 MG tablet Take 10 mg by mouth daily.     risperiDONE (RISPERDAL) 3 MG tablet Take 1 tablet (3 mg total) by mouth 2 (two) times daily. 60 tablet 0   traZODone (DESYREL) 100 MG tablet Take 1 tablet  (100 mg total) by mouth at bedtime. 30 tablet 3   No current facility-administered medications for this visit.      REVIEW OF SYSTEMS (Negative unless checked)  Constitutional: [] Weight loss  [] Fever  [] Chills Cardiac: [] Chest pain   [] Chest pressure   [] Palpitations   [] Shortness of breath when laying flat   [] Shortness of breath at rest   [] Shortness of breath with exertion. Vascular:  [] Pain in legs with walking   [] Pain in legs at rest   [] Pain in legs when laying flat   [] Claudication   [] Pain in feet when walking  [] Pain in feet at rest  [] Pain in feet when laying flat   [] History of DVT   [] Phlebitis   [] Swelling in legs   [] Varicose veins   [] Non-healing ulcers Pulmonary:   [] Uses home oxygen   [] Productive cough   [] Hemoptysis   [] Wheeze  [] COPD   [] Asthma Neurologic:  [] Dizziness  [] Blackouts   [] Seizures   [] History of stroke   [] History of TIA  [] Aphasia   [] Temporary blindness   [] Dysphagia   [] Weakness or numbness in arms   [] Weakness or numbness in legs Musculoskeletal:  [] Arthritis   [] Joint swelling   [] Joint pain   [] Low back pain Hematologic:  [] Easy bruising  [] Easy bleeding   [] Hypercoagulable state   [] Anemic  [] Hepatitis Gastrointestinal:  [] Blood in stool   [] Vomiting blood  [] Gastroesophageal reflux/heartburn   [] Abdominal pain Genitourinary:  [] Chronic kidney disease   [] Difficult urination  [] Frequent urination  [] Burning with urination   [] Hematuria Skin:  [] Rashes   [x] Ulcers   [x] Wounds Psychological:  [] History of anxiety   []  History of major depression.    Physical Exam BP 100/62 (BP Location: Right Arm)   Pulse (!) 102   Resp 18   Ht 6\' 4"  (1.93 m)   Wt 217 lb 6.4 oz (98.6 kg)   BMI 26.46 kg/m  Gen:  WD/WN, NAD. Disheveled appearing Head: [place]/AT, No temporalis wasting.  Ear/Nose/Throat: Hearing grossly intact, nares w/o erythema or drainage, oropharynx w/o Erythema/Exudate Eyes: Conjunctiva clear, sclera non-icteric  Neck: trachea midline.  No JVD.   Pulmonary:  Good air movement, respirations not labored, no use of accessory muscles  Cardiac: tachycardic Vascular:  Vessel Right Left  Radial Palpable Palpable                          PT NP NP  DP NP NP   Gastrointestinal:. No  masses, surgical incisions, or scars. Musculoskeletal: M/S 5/5 throughout.  Feet are dark and discolored with trophic changes.  1 to 2 cm ulceration on the top of the left forefoot in the midportion of the foot.  This is pale and has no granulation tissue.  3 cm ulceration on the right lateral lower leg that is clean and granulating. Neurologic: Sensation grossly intact in extremities.  Symmetrical.  Speech is difficult to understand. Motor exam as listed above. Psychiatric: Judgment and insight are very poor. Dermatologic: Wounds on both lower extremities as described above    Radiology No results found.  Labs Recent Results (from the past 2160 hour(s))  CBC with Differential     Status: Abnormal   Collection Time: 10/05/22 11:46 AM  Result Value Ref Range   WBC 5.8 4.0 - 10.5 K/uL   RBC 4.03 (L) 4.22 - 5.81 MIL/uL   Hemoglobin 11.9 (L) 13.0 - 17.0 g/dL   HCT 16.1 (L) 09.6 - 04.5 %   MCV 92.8 80.0 - 100.0 fL   MCH 29.5 26.0 - 34.0 pg   MCHC 31.8 30.0 - 36.0 g/dL   RDW 40.9 81.1 - 91.4 %   Platelets 181 150 - 400 K/uL   nRBC 0.0 0.0 - 0.2 %   Neutrophils Relative % 70 %   Neutro Abs 4.1 1.7 - 7.7 K/uL   Lymphocytes Relative 16 %   Lymphs Abs 0.9 0.7 - 4.0 K/uL   Monocytes Relative 10 %   Monocytes Absolute 0.6 0.1 - 1.0 K/uL   Eosinophils Relative 3 %   Eosinophils Absolute 0.2 0.0 - 0.5 K/uL   Basophils Relative 0 %   Basophils Absolute 0.0 0.0 - 0.1 K/uL   Immature Granulocytes 1 %   Abs Immature Granulocytes 0.03 0.00 - 0.07 K/uL    Comment: Performed at [facility], [address] [place], [place] [address]  Comprehensive metabolic panel     Status: Abnormal   Collection Time: 10/05/22 11:46 AM  Result Value Ref Range   Sodium  135 135 - 145 mmol/L   Potassium 4.4 3.5 - 5.1 mmol/L   Chloride 102 98 - 111 mmol/L   CO2 26 22 - 32 mmol/L   Glucose, Bld 198 (H) 70 - 99 mg/dL    Comment: Glucose reference range applies only to samples taken after fasting for at least 8 hours.   BUN 15 8 - 23 mg/dL   Creatinine, Ser 6.21 0.61 - 1.24 mg/dL   Calcium 9.0 8.9 - 30.8 mg/dL   Total Protein 7.1 6.5 - 8.1 g/dL   Albumin 3.5 3.5 - 5.0 g/dL   AST 22 15 - 41 U/L   ALT 18 0 - 44 U/L   Alkaline Phosphatase 79 38 - 126 U/L   Total Bilirubin 0.2 (L) 0.3 - 1.2 mg/dL   GFR, Estimated >65 >78 mL/min    Comment: (NOTE) Calculated using the CKD-EPI Creatinine Equation (2021)    Anion gap 7 5 - 15    Comment: Performed at [facility], [address], [place], [place] [address]  CBG monitoring, ED     Status: Abnormal   Collection Time: 12/06/22  6:37 PM  Result Value Ref Range   Glucose-Capillary 386 (H) 70 - 99 mg/dL    Comment: Glucose reference range applies only to samples taken after fasting for at least 8 hours.  Basic metabolic panel     Status: Abnormal   Collection Time: 12/06/22  7:18 PM  Result Value  Ref Range   Sodium 129 (L) 135 - 145 mmol/L   Potassium 4.6 3.5 - 5.1 mmol/L   Chloride 94 (L) 98 - 111 mmol/L   CO2 26 22 - 32 mmol/L   Glucose, Bld 369 (H) 70 - 99 mg/dL    Comment: Glucose reference range applies only to samples taken after fasting for at least 8 hours.   BUN 18 8 - 23 mg/dL   Creatinine, Ser 1.61 (H) 0.61 - 1.24 mg/dL   Calcium 9.4 8.9 - 09.6 mg/dL   GFR, Estimated >04 >54 mL/min    Comment: (NOTE) Calculated using the CKD-EPI Creatinine Equation (2021)    Anion gap 9 5 - 15    Comment: Performed at [facility], [address] [place], [place] [address]  CBC     Status: Abnormal   Collection Time: 12/06/22  7:18 PM  Result Value Ref Range   WBC 6.4 4.0 - 10.5 K/uL   RBC 4.21 (L) 4.22 - 5.81 MIL/uL   Hemoglobin 12.4 (L) 13.0 - 17.0 g/dL   HCT 91.4 (L) 78.2 - 95.6 %   MCV 88.8 80.0 -  100.0 fL   MCH 29.5 26.0 - 34.0 pg   MCHC 33.2 30.0 - 36.0 g/dL   RDW 21.3 08.6 - 57.8 %   Platelets 196 150 - 400 K/uL   nRBC 0.0 0.0 - 0.2 %    Comment: Performed at [facility], [address] [place], [place] [address]  CBG monitoring, ED     Status: Abnormal   Collection Time: 12/06/22  9:19 PM  Result Value Ref Range   Glucose-Capillary 312 (H) 70 - 99 mg/dL    Comment: Glucose reference range applies only to samples taken after fasting for at least 8 hours.  Urinalysis, Routine w reflex microscopic -Urine, Clean Catch     Status: Abnormal   Collection Time: 12/06/22  9:22 PM  Result Value Ref Range   Color, Urine STRAW (A) YELLOW   APPearance CLEAR CLEAR   Specific Gravity, Urine 1.006 1.005 - 1.030   pH 6.0 5.0 - 8.0   Glucose, UA >=500 (A) NEGATIVE mg/dL   Hgb urine dipstick NEGATIVE NEGATIVE   Bilirubin Urine NEGATIVE NEGATIVE   Ketones, ur NEGATIVE NEGATIVE mg/dL   Protein, ur NEGATIVE NEGATIVE mg/dL   Nitrite NEGATIVE NEGATIVE   Leukocytes,Ua NEGATIVE NEGATIVE   RBC / HPF 0-5 0 - 5 RBC/hpf   WBC, UA 0-5 0 - 5 WBC/hpf   Bacteria, UA NONE SEEN NONE SEEN   Squamous Epithelial / HPF 0-5 0 - 5 /HPF    Comment: Performed at [facility], [address], [place], [place] [address]    Assessment/Plan:  Hypertension blood pressure control important in reducing the progression of atherosclerotic disease. On appropriate oral medications.   Diabetes (HCC) blood glucose control important in reducing the progression of atherosclerotic disease. Also, involved in wound healing. On appropriate medications.   Hyperlipidemia lipid control important in reducing the progression of atherosclerotic disease. Continue statin therapy   Atherosclerosis of native arteries of the extremities with ulceration (HCC) Patient has severe ulcerations of both lower extremities more worrisome on the left than the right.  He has signs of arterial insufficiency with a litany of atherosclerotic  risk factors including ongoing tobacco use.  He is a very poor historian and is difficult to discern how much of the information he understands.  His group home worker is present with him as well.  He will need  angiography and possible revascularization of both lower extremities in a staged fashion.  The left will be done first as this is the more worrisome area.  This is clearly a critical and limb threatening situation in both lower extremities with a high risk of limb loss and other issues.      Festus Barren 12/24/2022, 4:02 PM   This note was created with Dragon medical transcription system.  Any errors from dictation are unintentional.

## 2022-12-24 NOTE — Assessment & Plan Note (Signed)
lipid control important in reducing the progression of atherosclerotic disease. Continue statin therapy  

## 2022-12-24 NOTE — Assessment & Plan Note (Signed)
Patient has severe ulcerations of both lower extremities more worrisome on the left than the right.  He has signs of arterial insufficiency with a litany of atherosclerotic risk factors including ongoing tobacco use.  He is a very poor historian and is difficult to discern how much of the information he understands.  His group home worker is present with him as well.  He will need angiography and possible revascularization of both lower extremities in a staged fashion.  The left will be done first as this is the more worrisome area.  This is clearly a critical and limb threatening situation in both lower extremities with a high risk of limb loss and other issues.

## 2022-12-24 NOTE — Progress Notes (Signed)
Patient ID: Austin Mcdonald, male   DOB: Nov 24, 1960, 62 y.o.   MRN: [id number]  Chief Complaint  Patient presents with   New Patient (Initial Visit)    NP. consult. BLE edema/ulcer. hatchet    HPI Austin Mcdonald is a 62 y.o. male.  I am asked to see the patient by Austin Mcdonald for evaluation of non-healing ulcerations of the lower extremities.  Patient has an extensive psychiatric history and lives in a group home so he really cannot provide much of the history.  The group home worker does help and try to provide some of the history.  The patient continues to smoke.  He has had ulcerations on both feet and ankles.  These are painful to him.  The 1 on the right leg is in the right lower leg laterally and has fair granulation tissue.  The left foot on the base of the second and third toes is about a 2 cm ulceration with no granulation tissue which looks pale.  Both feet are dark with trophic changes.  Again, he really cannot provide much history for me.   Past Medical History:  Diagnosis Date   Agitation    Dementia (HCC)    Depression    Diabetes (HCC)    Disturbance of salivary secretion    GERD (gastroesophageal reflux disease) 05/09/2016   Hyperlipidemia    Hypertension    Restless    Rhinitis    Schizophrenic disorder (HCC)    Tardive dyskinesia     Past Surgical History:  Procedure Laterality Date   APPENDECTOMY     COLONOSCOPY N/A 02/23/2015   Procedure: COLONOSCOPY;  Surgeon: Malissa Hippo, MD;  [facility];  Service: Endoscopy;  Laterality: N/A;   ESOPHAGEAL DILATION N/A 02/23/2015   Procedure: ESOPHAGEAL DILATION;  Surgeon: Malissa Hippo, MD;  [facility];  Service: Endoscopy;  Laterality: N/A;   ESOPHAGOGASTRODUODENOSCOPY N/A 02/23/2015   Procedure: ESOPHAGOGASTRODUODENOSCOPY (EGD);  Surgeon: Malissa Hippo, MD;  [facility];  Service: Endoscopy;  Laterality: N/A;  2:00     History reviewed. No pertinent family  history. Unable to obtain from the patient. He is a poor historian   Social History   Tobacco Use   Smoking status: Every Day    Current packs/day: 0.50    Types: Cigarettes   Smokeless tobacco: Never   Tobacco comments:    10 cigarettes a day  Vaping Use   Vaping status: Never Used  Substance Use Topics   Alcohol use: No    Alcohol/week: 0.0 standard drinks of alcohol   Drug use: No     Allergies  Allergen Reactions   Bee Pollen Other (See Comments)    Current Outpatient Medications  Medication Sig Dispense Refill   albuterol (VENTOLIN HFA) 108 (90 Base) MCG/ACT inhaler Inhale 2 puffs into the lungs every 4 (four) hours as needed for wheezing or shortness of breath.     amLODipine (NORVASC) 5 MG tablet Take 1 tablet (5 mg total) by mouth daily. 90 tablet 3   aspirin 81 MG tablet Take 81 mg by mouth daily.     atropine 1 % ophthalmic solution Place 1 drop into both eyes 3 (three) times daily.     AUSTEDO 12 MG TABS Take 1 tablet by mouth 2 (two) times daily.     cetirizine (ZYRTEC) 10 MG tablet Take 10 mg by mouth daily.     clonazePAM (KLONOPIN) 1 MG disintegrating  tablet Take 1 mg by mouth 2 (two) times daily.     donepezil (ARICEPT) 10 MG tablet Take 1 tablet (10 mg total) by mouth at bedtime. 30 tablet 3   levETIRAcetam (KEPPRA) 1000 MG tablet Take 1 tablet (1,000 mg total) by mouth 2 (two) times daily. 60 tablet 3   lisinopril (ZESTRIL) 40 MG tablet Take 40 mg by mouth 2 (two) times daily.     meloxicam (MOBIC) 15 MG tablet Take 15 mg by mouth daily.     metFORMIN (GLUCOPHAGE) 500 MG tablet Take 500 mg by mouth at bedtime. 1,000 mg Q breakfast 500 nightly     omeprazole (PRILOSEC) 20 MG capsule TAKE (1) CAPSULE BY MOUTH ONCEDAILY. 30 capsule 2   pravastatin (PRAVACHOL) 10 MG tablet Take 10 mg by mouth daily.     risperiDONE (RISPERDAL) 3 MG tablet Take 1 tablet (3 mg total) by mouth 2 (two) times daily. 60 tablet 0   traZODone (DESYREL) 100 MG tablet Take 1 tablet  (100 mg total) by mouth at bedtime. 30 tablet 3   No current facility-administered medications for this visit.      REVIEW OF SYSTEMS (Negative unless checked)  Constitutional: [] Weight loss  [] Fever  [] Chills Cardiac: [] Chest pain   [] Chest pressure   [] Palpitations   [] Shortness of breath when laying flat   [] Shortness of breath at rest   [] Shortness of breath with exertion. Vascular:  [] Pain in legs with walking   [] Pain in legs at rest   [] Pain in legs when laying flat   [] Claudication   [] Pain in feet when walking  [] Pain in feet at rest  [] Pain in feet when laying flat   [] History of DVT   [] Phlebitis   [] Swelling in legs   [] Varicose veins   [] Non-healing ulcers Pulmonary:   [] Uses home oxygen   [] Productive cough   [] Hemoptysis   [] Wheeze  [] COPD   [] Asthma Neurologic:  [] Dizziness  [] Blackouts   [] Seizures   [] History of stroke   [] History of TIA  [] Aphasia   [] Temporary blindness   [] Dysphagia   [] Weakness or numbness in arms   [] Weakness or numbness in legs Musculoskeletal:  [] Arthritis   [] Joint swelling   [] Joint pain   [] Low back pain Hematologic:  [] Easy bruising  [] Easy bleeding   [] Hypercoagulable state   [] Anemic  [] Hepatitis Gastrointestinal:  [] Blood in stool   [] Vomiting blood  [] Gastroesophageal reflux/heartburn   [] Abdominal pain Genitourinary:  [] Chronic kidney disease   [] Difficult urination  [] Frequent urination  [] Burning with urination   [] Hematuria Skin:  [] Rashes   [x] Ulcers   [x] Wounds Psychological:  [] History of anxiety   []  History of major depression.    Physical Exam BP 100/62 (BP Location: Right Arm)   Pulse (!) 102   Resp 18   Ht 6\' 4"  (1.93 m)   Wt 217 lb 6.4 oz (98.6 kg)   BMI 26.46 kg/m  Gen:  WD/WN, NAD. Disheveled appearing Head: [place]/AT, No temporalis wasting.  Ear/Nose/Throat: Hearing grossly intact, nares w/o erythema or drainage, oropharynx w/o Erythema/Exudate Eyes: Conjunctiva clear, sclera non-icteric  Neck: trachea midline.  No JVD.   Pulmonary:  Good air movement, respirations not labored, no use of accessory muscles  Cardiac: tachycardic Vascular:  Vessel Right Left  Radial Palpable Palpable                          PT NP NP  DP NP NP   Gastrointestinal:. No  masses, surgical incisions, or scars. Musculoskeletal: M/S 5/5 throughout.  Feet are dark and discolored with trophic changes.  1 to 2 cm ulceration on the top of the left forefoot in the midportion of the foot.  This is pale and has no granulation tissue.  3 cm ulceration on the right lateral lower leg that is clean and granulating. Neurologic: Sensation grossly intact in extremities.  Symmetrical.  Speech is difficult to understand. Motor exam as listed above. Psychiatric: Judgment and insight are very poor. Dermatologic: Wounds on both lower extremities as described above    Radiology No results found.  Labs Recent Results (from the past 2160 hour(s))  CBC with Differential     Status: Abnormal   Collection Time: 10/05/22 11:46 AM  Result Value Ref Range   WBC 5.8 4.0 - 10.5 K/uL   RBC 4.03 (L) 4.22 - 5.81 MIL/uL   Hemoglobin 11.9 (L) 13.0 - 17.0 g/dL   HCT 16.1 (L) 09.6 - 04.5 %   MCV 92.8 80.0 - 100.0 fL   MCH 29.5 26.0 - 34.0 pg   MCHC 31.8 30.0 - 36.0 g/dL   RDW 40.9 81.1 - 91.4 %   Platelets 181 150 - 400 K/uL   nRBC 0.0 0.0 - 0.2 %   Neutrophils Relative % 70 %   Neutro Abs 4.1 1.7 - 7.7 K/uL   Lymphocytes Relative 16 %   Lymphs Abs 0.9 0.7 - 4.0 K/uL   Monocytes Relative 10 %   Monocytes Absolute 0.6 0.1 - 1.0 K/uL   Eosinophils Relative 3 %   Eosinophils Absolute 0.2 0.0 - 0.5 K/uL   Basophils Relative 0 %   Basophils Absolute 0.0 0.0 - 0.1 K/uL   Immature Granulocytes 1 %   Abs Immature Granulocytes 0.03 0.00 - 0.07 K/uL    Comment: Performed at [facility], [address] [place], [place] [address]  Comprehensive metabolic panel     Status: Abnormal   Collection Time: 10/05/22 11:46 AM  Result Value Ref Range   Sodium  135 135 - 145 mmol/L   Potassium 4.4 3.5 - 5.1 mmol/L   Chloride 102 98 - 111 mmol/L   CO2 26 22 - 32 mmol/L   Glucose, Bld 198 (H) 70 - 99 mg/dL    Comment: Glucose reference range applies only to samples taken after fasting for at least 8 hours.   BUN 15 8 - 23 mg/dL   Creatinine, Ser 6.21 0.61 - 1.24 mg/dL   Calcium 9.0 8.9 - 30.8 mg/dL   Total Protein 7.1 6.5 - 8.1 g/dL   Albumin 3.5 3.5 - 5.0 g/dL   AST 22 15 - 41 U/L   ALT 18 0 - 44 U/L   Alkaline Phosphatase 79 38 - 126 U/L   Total Bilirubin 0.2 (L) 0.3 - 1.2 mg/dL   GFR, Estimated >65 >78 mL/min    Comment: (NOTE) Calculated using the CKD-EPI Creatinine Equation (2021)    Anion gap 7 5 - 15    Comment: Performed at [facility], [address], [place], [place] [address]  CBG monitoring, ED     Status: Abnormal   Collection Time: 12/06/22  6:37 PM  Result Value Ref Range   Glucose-Capillary 386 (H) 70 - 99 mg/dL    Comment: Glucose reference range applies only to samples taken after fasting for at least 8 hours.  Basic metabolic panel     Status: Abnormal   Collection Time: 12/06/22  7:18 PM  Result Value  Ref Range   Sodium 129 (L) 135 - 145 mmol/L   Potassium 4.6 3.5 - 5.1 mmol/L   Chloride 94 (L) 98 - 111 mmol/L   CO2 26 22 - 32 mmol/L   Glucose, Bld 369 (H) 70 - 99 mg/dL    Comment: Glucose reference range applies only to samples taken after fasting for at least 8 hours.   BUN 18 8 - 23 mg/dL   Creatinine, Ser 1.61 (H) 0.61 - 1.24 mg/dL   Calcium 9.4 8.9 - 09.6 mg/dL   GFR, Estimated >04 >54 mL/min    Comment: (NOTE) Calculated using the CKD-EPI Creatinine Equation (2021)    Anion gap 9 5 - 15    Comment: Performed at [facility], [address] [place], [place] [address]  CBC     Status: Abnormal   Collection Time: 12/06/22  7:18 PM  Result Value Ref Range   WBC 6.4 4.0 - 10.5 K/uL   RBC 4.21 (L) 4.22 - 5.81 MIL/uL   Hemoglobin 12.4 (L) 13.0 - 17.0 g/dL   HCT 91.4 (L) 78.2 - 95.6 %   MCV 88.8 80.0 -  100.0 fL   MCH 29.5 26.0 - 34.0 pg   MCHC 33.2 30.0 - 36.0 g/dL   RDW 21.3 08.6 - 57.8 %   Platelets 196 150 - 400 K/uL   nRBC 0.0 0.0 - 0.2 %    Comment: Performed at [facility], [address] [place], [place] [address]  CBG monitoring, ED     Status: Abnormal   Collection Time: 12/06/22  9:19 PM  Result Value Ref Range   Glucose-Capillary 312 (H) 70 - 99 mg/dL    Comment: Glucose reference range applies only to samples taken after fasting for at least 8 hours.  Urinalysis, Routine w reflex microscopic -Urine, Clean Catch     Status: Abnormal   Collection Time: 12/06/22  9:22 PM  Result Value Ref Range   Color, Urine STRAW (A) YELLOW   APPearance CLEAR CLEAR   Specific Gravity, Urine 1.006 1.005 - 1.030   pH 6.0 5.0 - 8.0   Glucose, UA >=500 (A) NEGATIVE mg/dL   Hgb urine dipstick NEGATIVE NEGATIVE   Bilirubin Urine NEGATIVE NEGATIVE   Ketones, ur NEGATIVE NEGATIVE mg/dL   Protein, ur NEGATIVE NEGATIVE mg/dL   Nitrite NEGATIVE NEGATIVE   Leukocytes,Ua NEGATIVE NEGATIVE   RBC / HPF 0-5 0 - 5 RBC/hpf   WBC, UA 0-5 0 - 5 WBC/hpf   Bacteria, UA NONE SEEN NONE SEEN   Squamous Epithelial / HPF 0-5 0 - 5 /HPF    Comment: Performed at [facility], [address], [place], [place] [address]    Assessment/Plan:  Hypertension blood pressure control important in reducing the progression of atherosclerotic disease. On appropriate oral medications.   Diabetes (HCC) blood glucose control important in reducing the progression of atherosclerotic disease. Also, involved in wound healing. On appropriate medications.   Hyperlipidemia lipid control important in reducing the progression of atherosclerotic disease. Continue statin therapy   Atherosclerosis of native arteries of the extremities with ulceration (HCC) Patient has severe ulcerations of both lower extremities more worrisome on the left than the right.  He has signs of arterial insufficiency with a litany of atherosclerotic  risk factors including ongoing tobacco use.  He is a very poor historian and is difficult to discern how much of the information he understands.  His group home worker is present with him as well.  He will need  angiography and possible revascularization of both lower extremities in a staged fashion.  The left will be done first as this is the more worrisome area.  This is clearly a critical and limb threatening situation in both lower extremities with a high risk of limb loss and other issues.      Festus Barren 12/24/2022, 4:02 PM   This note was created with Dragon medical transcription system.  Any errors from dictation are unintentional.

## 2022-12-24 NOTE — Patient Instructions (Signed)
Angiogram  An angiogram is a procedure used to examine the blood vessels. In this procedure, contrast dye is injected through a soft tube (catheter) into an artery. X-rays are then taken, which show if there is a blockage or problem in a blood vessel. The catheter may be inserted in: The groin area. This is the most common. The fold of the arm, near the elbow. The wrist. Tell a health care provider about: Any allergies you have, including allergies to medicines, shellfish, contrast dye, or iodine. All medicines you are taking, including vitamins, herbs, eye drops, creams, and over-the-counter medicines. Any problems you or family members have had with anesthesia. Any bleeding problems you have. Any surgeries you have had. Any medical conditions you have or have had, including any kidney problems or kidney failure. Whether you are pregnant or may be pregnant. Whether you are breastfeeding. Any condition that may increase your stress and prevent you from lying still. This includes anxiety disorders or chronic pain. What are the risks? Your health care provider will talk with you about risks. These may include: Infection. Bleeding and bruising. Allergic reactions to medicines or dyes, including the contrast dye used. Damage to nearby structures or organs, including damage to blood vessels and kidney damage from the contrast dye. Blood clots that can lead to a stroke or heart attack. Death. What happens before the procedure? When to stop eating and drinking Follow instructions from your health care provider about what you may eat and drink before your procedure. These may include: 8 hours before your procedure Stop eating most foods. Do not eat meat, fried foods, or fatty foods. Eat only light foods, such as toast or crackers. All liquids are okay except energy drinks and alcohol. 6 hours before your procedure Stop eating. Drink only clear liquids, such as water, clear fruit juice,  black coffee, plain tea, and sports drinks. Do not drink energy drinks or alcohol. 2 hours before your procedure Stop drinking all liquids. You may be allowed to take medicines with small sips of water. If you do not follow your health care provider's instructions, your procedure may be delayed or canceled. Medicines Ask your health care provider about: Changing or stopping your regular medicines. These include any diabetes medicines or blood thinners you take. Taking medicines such as aspirin and ibuprofen. These medicines can thin your blood. Do not take them unless your health care provider tells you to. Taking over-the-counter medicines, vitamins, herbs, and supplements. Surgery safety Ask your health care provider: How your insertion site will be marked. What steps will be taken to help prevent infection. These may include: Removing hair at the insertion site. Washing skin with a germ-killing soap. General instructions Do not use any products that contain nicotine or tobacco for at least 4 weeks before the procedure. These products include cigarettes, chewing tobacco, and vaping devices, such as e-cigarettes. If you need help quitting, ask your health care provider. You may have blood samples taken. If you will be going home right after the procedure, plan to have a responsible adult: Take you home from the hospital or clinic. You will not be allowed to drive. Care for you for the time you are told. What happens during the procedure? You will lie on your back on an X-ray table. You may be strapped to the table if it is tilted. An IV will be inserted into one of your veins. Electrodes may be placed on your chest to monitor your heart rate during the procedure.  You will be given one or both of the following: A sedative. This helps you relax. Anesthesia. This will numb the area where the catheter will be inserted. A small incision will be made for catheter insertion. The catheter  will be inserted into an artery using a guide wire. An X-ray procedure (fluoroscopy) will be used to help guide the catheter to the blood vessel to be examined. A contrast dye will then be injected into the catheter, and X-rays will be taken. The contrast will help to show where any narrowing or blockages are located in the blood vessels. You may feel flushed as the contrast dye is injected. Tell your health care provider if you develop chest pain or trouble breathing. After the fluoroscopy is complete, the catheter will be removed. Pressure will be applied to stop bleeding. A closure device may also be used. A bandage (dressing) will be placed over the site where the catheter was inserted. The procedure may vary among health care providers and hospitals. What happens after the procedure? Your blood pressure, heart rate, breathing rate, and blood oxygen level will be monitored until you leave the hospital or clinic. You will be kept in bed lying flat for a period of time. If the catheter was inserted through your leg, you will be instructed not to bend or cross your legs. The insertion area and the pulse in your feet or wrist will be checked often. You will be instructed to drink plenty of fluids. This will help wash the contrast dye out of your body. You may have more blood tests and X-rays. You may also have a test that records the electrical activity of your heart (electrocardiogram, or ECG). If you were given a sedative, do not drive or operate machinery until your health care provider says that it is safe. You may have to avoid lifting. Ask your health care provider how much you can safely lift. It is up to you to get your test results. Ask your health care provider, or the department that is doing the test, when your results will be ready. This information is not intended to replace advice given to you by your health care provider. Make sure you discuss any questions you have with your health  care provider. Document Revised: 10/03/2021 Document Reviewed: 10/03/2021 Elsevier Patient Education  2024 [employer].

## 2022-12-26 ENCOUNTER — Ambulatory Visit
Admission: RE | Admit: 2022-12-26 | Discharge: 2022-12-26 | Disposition: A | Discharge status: Home / Self Care | Payer: Medicare Other | Attending: Vascular Surgery | Admitting: Vascular Surgery

## 2022-12-26 ENCOUNTER — Encounter
Admission: RE | Disposition: A | Discharge status: Home / Self Care | Payer: Self-pay | Source: Home / Self Care | Attending: Vascular Surgery

## 2022-12-26 ENCOUNTER — Other Ambulatory Visit: Payer: Self-pay

## 2022-12-26 ENCOUNTER — Encounter: Payer: Self-pay | Admitting: Vascular Surgery

## 2022-12-26 DIAGNOSIS — Z79899 Other long term (current) drug therapy: Secondary | ICD-10-CM | POA: Diagnosis not present

## 2022-12-26 DIAGNOSIS — I70245 Atherosclerosis of native arteries of left leg with ulceration of other part of foot: Secondary | ICD-10-CM | POA: Diagnosis not present

## 2022-12-26 DIAGNOSIS — E1151 Type 2 diabetes mellitus with diabetic peripheral angiopathy without gangrene: Secondary | ICD-10-CM | POA: Diagnosis present

## 2022-12-26 DIAGNOSIS — I739 Peripheral vascular disease, unspecified: Secondary | ICD-10-CM

## 2022-12-26 DIAGNOSIS — L97529 Non-pressure chronic ulcer of other part of left foot with unspecified severity: Secondary | ICD-10-CM | POA: Insufficient documentation

## 2022-12-26 DIAGNOSIS — L97519 Non-pressure chronic ulcer of other part of right foot with unspecified severity: Secondary | ICD-10-CM | POA: Diagnosis not present

## 2022-12-26 DIAGNOSIS — E785 Hyperlipidemia, unspecified: Secondary | ICD-10-CM | POA: Diagnosis not present

## 2022-12-26 DIAGNOSIS — Z7984 Long term (current) use of oral hypoglycemic drugs: Secondary | ICD-10-CM | POA: Insufficient documentation

## 2022-12-26 DIAGNOSIS — E11621 Type 2 diabetes mellitus with foot ulcer: Secondary | ICD-10-CM | POA: Diagnosis not present

## 2022-12-26 DIAGNOSIS — I10 Essential (primary) hypertension: Secondary | ICD-10-CM | POA: Insufficient documentation

## 2022-12-26 DIAGNOSIS — I70235 Atherosclerosis of native arteries of right leg with ulceration of other part of foot: Secondary | ICD-10-CM | POA: Diagnosis not present

## 2022-12-26 DIAGNOSIS — F17210 Nicotine dependence, cigarettes, uncomplicated: Secondary | ICD-10-CM | POA: Insufficient documentation

## 2022-12-26 HISTORY — PX: LOWER EXTREMITY ANGIOGRAPHY: CATH118251

## 2022-12-26 LAB — CREATININE, SERUM
Creatinine, Ser: 1.77 mg/dL — ABNORMAL HIGH (ref 0.61–1.24)
GFR, Estimated: 42 mL/min — ABNORMAL LOW (ref >60)

## 2022-12-26 LAB — GLUCOSE, CAPILLARY: Glucose-Capillary: 285 mg/dL — ABNORMAL HIGH (ref 70–99)

## 2022-12-26 LAB — BUN: BUN: 31 mg/dL — ABNORMAL HIGH (ref 8–23)

## 2022-12-26 SURGERY — LOWER EXTREMITY ANGIOGRAPHY
Anesthesia: Moderate Sedation | Site: Leg Lower | Laterality: Left

## 2022-12-26 MED ORDER — FENTANYL CITRATE (PF) 100 MCG/2ML IJ SOLN
INTRAMUSCULAR | Status: DC | PRN
  Administered 2022-12-26: 50 ug via INTRAVENOUS

## 2022-12-26 MED ORDER — HEPARIN SODIUM (PORCINE) 1000 UNIT/ML IJ SOLN
INTRAMUSCULAR | Status: AC
  Filled 2022-12-26: qty 10

## 2022-12-26 MED ORDER — SODIUM CHLORIDE 0.9 % IV SOLN: INTRAVENOUS | Status: DC

## 2022-12-26 MED ORDER — DIPHENHYDRAMINE HCL 50 MG/ML IJ SOLN: 50.0000 mg | Freq: Once | INTRAMUSCULAR | Status: DC | PRN

## 2022-12-26 MED ORDER — IODIXANOL 320 MG/ML IV SOLN
INTRAVENOUS | Status: DC | PRN
  Administered 2022-12-26: 30 mL

## 2022-12-26 MED ORDER — FAMOTIDINE 20 MG PO TABS: 40.0000 mg | ORAL_TABLET | Freq: Once | ORAL | Status: DC | PRN

## 2022-12-26 MED ORDER — SODIUM CHLORIDE 0.9 % IV BOLUS: 250.0000 mL | Freq: Once | INTRAVENOUS | Status: DC

## 2022-12-26 MED ORDER — MIDAZOLAM HCL 5 MG/5ML IJ SOLN
INTRAMUSCULAR | Status: AC
  Filled 2022-12-26: qty 5

## 2022-12-26 MED ORDER — CEFAZOLIN SODIUM-DEXTROSE 2-4 GM/100ML-% IV SOLN
INTRAVENOUS | Status: AC
  Filled 2022-12-26: qty 100

## 2022-12-26 MED ORDER — LIDOCAINE-EPINEPHRINE (PF) 1 %-1:200000 IJ SOLN
INTRAMUSCULAR | Status: DC | PRN
  Administered 2022-12-26: 10 mL

## 2022-12-26 MED ORDER — CEFAZOLIN SODIUM-DEXTROSE 2-4 GM/100ML-% IV SOLN
2.0000 g | INTRAVENOUS | Status: AC
  Administered 2022-12-26: 2 g via INTRAVENOUS

## 2022-12-26 MED ORDER — ONDANSETRON HCL 4 MG/2ML IJ SOLN: 4.0000 mg | Freq: Four times a day (QID) | INTRAMUSCULAR | Status: DC | PRN

## 2022-12-26 MED ORDER — FENTANYL CITRATE (PF) 100 MCG/2ML IJ SOLN
INTRAMUSCULAR | Status: AC
  Filled 2022-12-26: qty 2

## 2022-12-26 MED ORDER — HEPARIN (PORCINE) IN NACL 1000-0.9 UT/500ML-% IV SOLN
INTRAVENOUS | Status: DC | PRN
  Administered 2022-12-26: 1000 mL

## 2022-12-26 MED ORDER — MIDAZOLAM HCL 2 MG/2ML IJ SOLN
INTRAMUSCULAR | Status: DC | PRN
  Administered 2022-12-26: 1 mg via INTRAVENOUS

## 2022-12-26 MED ORDER — METHYLPREDNISOLONE SODIUM SUCC 125 MG IJ SOLR: 125.0000 mg | Freq: Once | INTRAMUSCULAR | Status: DC | PRN

## 2022-12-26 MED ORDER — MIDAZOLAM HCL 2 MG/ML PO SYRP: 8.0000 mg | ORAL_SOLUTION | Freq: Once | ORAL | Status: DC | PRN

## 2022-12-26 MED ORDER — HYDROMORPHONE HCL 1 MG/ML IJ SOLN: 1.0000 mg | Freq: Once | INTRAMUSCULAR | Status: DC | PRN

## 2022-12-26 SURGICAL SUPPLY — 9 items
CATH ANGIO 5F PIGTAIL 65CM (CATHETERS) IMPLANT
COVER PROBE ULTRASOUND 5X96 (MISCELLANEOUS) IMPLANT
DEVICE STARCLOSE SE CLOSURE (Vascular Products) IMPLANT
GLIDEWIRE ADV .035X260CM (WIRE) IMPLANT
PACK ANGIOGRAPHY (CUSTOM PROCEDURE TRAY) ×1 IMPLANT
SHEATH BRITE TIP 5FRX11 (SHEATH) IMPLANT
SYR MEDRAD MARK 7 150ML (SYRINGE) IMPLANT
TUBING CONTRAST HIGH PRESS 72 (TUBING) IMPLANT
WIRE GUIDERIGHT .035X150 (WIRE) IMPLANT

## 2022-12-26 NOTE — Interval H&P Note (Signed)
History and Physical Interval Note:  12/26/2022 12:43 PM  Austin Mcdonald  has presented today for surgery, with the diagnosis of LLE Angio   PAD w ulceration [facility]   [phone].  The various methods of treatment have been discussed with the patient and family. After consideration of risks, benefits and other options for treatment, the patient has consented to  Procedure(s): Lower Extremity Angiography (Left) as a surgical intervention.  The patient's history has been reviewed, patient examined, no change in status, stable for surgery.  I have reviewed the patient's chart and labs.  Questions were answered to the patient's satisfaction.     Festus Barren

## 2022-12-26 NOTE — Op Note (Signed)
[facility]  Percutaneous Study/Intervention Procedural Note   Date of Surgery: 12/26/2022  Surgeon(s):Travanti Mcmanus    Assistants:none  Pre-operative Diagnosis: Bilateral lower extremity ulcerations with poorly palpable pulses  Post-operative diagnosis:  Same  Procedure(s) Performed:             1.  Ultrasound guidance for vascular access right femoral artery             2.  Catheter placement into left SFA from right femoral approach             3.  Aortogram and selective left lower extremity angiogram             4.  StarClose closure device right femoral artery  EBL: 5 cc  Contrast: 30 cc  Fluoro Time: 0.9 minutes  Moderate Conscious Sedation Time: approximately 13 minutes using 1 mg of Versed and 50 mcg of Fentanyl              Indications:  Patient is a 62 y.o.male with bilateral lower extremity nonhealing ulcerations, longstanding tobacco use, and poorly palpable pedal pulses with discolored feet with skin fibrosis and some edema.  The patient is brought in for angiography for further evaluation and potential treatment.  Due to the limb threatening nature of the situation, angiogram was performed for attempted limb salvage. The patient is aware that if the procedure fails, amputation would be expected.  The patient also understands that even with successful revascularization, amputation may still be required due to the severity of the situation.  Risks and benefits are discussed and informed consent is obtained.   Procedure:  The patient was identified and appropriate procedural time out was performed.  The patient was then placed supine on the table and prepped and draped in the usual sterile fashion. Moderate conscious sedation was administered during a face to face encounter with the patient throughout the procedure with my supervision of the RN administering medicines and monitoring the patient's vital signs, pulse oximetry, telemetry and mental status  throughout from the start of the procedure until the patient was taken to the recovery room. Ultrasound was used to evaluate the right common femoral artery.  It was patent .  A digital ultrasound image was acquired.  A Seldinger needle was used to access the right common femoral artery under direct ultrasound guidance and a permanent image was performed.  A 0.035 J wire was advanced without resistance and a 5Fr sheath was placed.  Pigtail catheter was placed into the aorta and an AP aortogram was performed. This demonstrated normal renal arteries and normal aorta and iliac segments without significant stenosis. I then crossed the aortic bifurcation and advanced to the left femoral head.  After the initial image, the pigtail catheter was advanced into the mid left SFA to opacify distally.  Selective left lower extremity angiogram was then performed. This demonstrated normal common femoral artery, profunda femoris artery, superficial femoral and popliteal arteries.  There was a typical tibial trifurcation.  The posterior tibial artery was large and was the dominant runoff to the foot.  The peroneal and anterior tibial arteries were smaller but did not have obvious focal stenosis and did provide 2 additional runoff vessels distally.  Patient has adequate blood flow for wound healing and no endovascular revascularization was necessary.  I elected to terminate the procedure. The sheath was removed and StarClose closure device was deployed in the right femoral artery with excellent hemostatic result. The patient was taken to  the recovery room in stable condition having tolerated the procedure well.  Findings:               Aortogram:  This demonstrated normal renal arteries and normal aorta and iliac segments without significant stenosis.             Left Lower Extremity:  This demonstrated normal common femoral artery, profunda femoris artery, superficial femoral and popliteal arteries.  There was a typical tibial  trifurcation.  The posterior tibial artery was large and was the dominant runoff to the foot.  The peroneal and anterior tibial arteries were smaller but did not have obvious focal stenosis and did provide 2 additional runoff vessels distally.   Disposition: Patient was taken to the recovery room in stable condition having tolerated the procedure well.  Complications: None  Festus Barren 12/26/2022 2:11 PM   This note was created with Dragon Medical transcription system. Any errors in dictation are purely unintentional.

## 2022-12-26 NOTE — Progress Notes (Deleted)
Pt resident of group home

## 2022-12-27 ENCOUNTER — Encounter: Payer: Self-pay | Admitting: Vascular Surgery

## 2023-01-02 ENCOUNTER — Ambulatory Visit: Admit: 2023-01-02 | Payer: Medicare Other | Admitting: Vascular Surgery

## 2023-01-02 DIAGNOSIS — I739 Peripheral vascular disease, unspecified: Secondary | ICD-10-CM

## 2023-01-02 SURGERY — LOWER EXTREMITY ANGIOGRAPHY
Anesthesia: Moderate Sedation | Site: Leg Lower | Laterality: Right

## 2023-01-24 ENCOUNTER — Ambulatory Visit (INDEPENDENT_AMBULATORY_CARE_PROVIDER_SITE_OTHER): Payer: Medicare Other | Admitting: Nurse Practitioner

## 2023-01-24 ENCOUNTER — Encounter (INDEPENDENT_AMBULATORY_CARE_PROVIDER_SITE_OTHER): Payer: Self-pay | Admitting: Nurse Practitioner

## 2023-01-24 VITALS — BP 105/63 | HR 90 | Resp 16 | Wt 217.0 lb

## 2023-01-24 DIAGNOSIS — I7025 Atherosclerosis of native arteries of other extremities with ulceration: Secondary | ICD-10-CM | POA: Diagnosis not present

## 2023-01-24 DIAGNOSIS — I10 Essential (primary) hypertension: Secondary | ICD-10-CM

## 2023-01-25 ENCOUNTER — Encounter (INDEPENDENT_AMBULATORY_CARE_PROVIDER_SITE_OTHER): Payer: Self-pay | Admitting: Nurse Practitioner

## 2023-01-25 NOTE — Progress Notes (Signed)
Subjective:    Patient ID: Austin Mcdonald, male    DOB: February 28, 1961, 62 y.o.   MRN: [id number] Chief Complaint  Patient presents with   Follow-up    4 week wound check    The patient is a 62 year old male who returns today after recent right lower extremity angiogram.  It was noted at that time that no intervention was necessary and he had adequate perfusion to heal wounds in his foot.  He also has a small ulceration on his left which is very well-healed.  He denies any pain close intervention" areas are well-healed.  He does not provide much history but his attendant that is present.  The wounds are appearing to improve but slowly.  He continues to follow with podiatry.    Review of Systems  Skin:  Positive for wound.  All other systems reviewed and are negative.      Objective:   Physical Exam Vitals reviewed.  HENT:     Head: Normocephalic.  Cardiovascular:     Rate and Rhythm: Normal rate.  Pulmonary:     Effort: Pulmonary effort is normal.  Skin:    General: Skin is warm and dry.  Neurological:     Mental Status: He is alert and oriented to person, place, and time. Mental status is at baseline.  Psychiatric:        Speech: He is noncommunicative.        Behavior: Behavior is cooperative.        Cognition and Memory: Cognition is impaired. Memory is impaired.        Judgment: Judgment normal.     BP 105/63 (BP Location: Right Arm)   Pulse 90   Resp 16   Wt 217 lb (98.4 kg)   BMI 26.41 kg/m   Past Medical History:  Diagnosis Date   Agitation    Dementia (HCC)    Depression    Diabetes (HCC)    Disturbance of salivary secretion    GERD (gastroesophageal reflux disease) 05/09/2016   Hyperlipidemia    Hypertension    Restless    Rhinitis    Schizophrenic disorder (HCC)    Tardive dyskinesia     Social History   Socioeconomic History   Marital status: Single    Spouse name: Not on file   Number of children: Not on file   Years of education: Not on  file   Highest education level: Not on file  Occupational History   Not on file  Tobacco Use   Smoking status: Every Day    Current packs/day: 0.50    Types: Cigarettes   Smokeless tobacco: Never   Tobacco comments:    10 cigarettes a day  Vaping Use   Vaping status: Never Used  Substance and Sexual Activity   Alcohol use: No    Alcohol/week: 0.0 standard drinks of alcohol   Drug use: No   Sexual activity: Not on file  Other Topics Concern   Not on file  Social History Narrative   Not on file   Social Determinants of Health   Financial Resource Strain: Not on file  Food Insecurity: Not on file  Transportation Needs: Not on file  Physical Activity: Not on file  Stress: Not on file  Social Connections: Not on file  Intimate Partner Violence: Not on file    Past Surgical History:  Procedure Laterality Date   APPENDECTOMY     COLONOSCOPY N/A 02/23/2015   Procedure: COLONOSCOPY;  Surgeon:  Malissa Hippo, MD;  [facility];  Service: Endoscopy;  Laterality: N/A;   ESOPHAGEAL DILATION N/A 02/23/2015   Procedure: ESOPHAGEAL DILATION;  Surgeon: Malissa Hippo, MD;  [facility]: [facility];  Service: Endoscopy;  Laterality: N/A;   ESOPHAGOGASTRODUODENOSCOPY N/A 02/23/2015   Procedure: ESOPHAGOGASTRODUODENOSCOPY (EGD);  Surgeon: Malissa Hippo, MD;  [facility]: [facility];  Service: Endoscopy;  Laterality: N/A;  2:00   LOWER EXTREMITY ANGIOGRAPHY Left 12/26/2022   Procedure: Lower Extremity Angiography;  Surgeon: Annice Needy, MD;  Location: [facility];  Service: Cardiovascular;  Laterality: Left;    History reviewed. No pertinent family history.  Allergies  Allergen Reactions   Bee Pollen Other (See Comments)       Latest Ref Rng & Units 12/06/2022    7:18 PM 10/05/2022   11:46 AM 07/20/2022   11:16 PM  CBC  WBC 4.0 - 10.5 K/uL 6.4  5.8  5.3   Hemoglobin 13.0 - 17.0 g/dL 60.4  54.0  98.1   Hematocrit 39.0 - 52.0 % 37.4  37.4  35.1   Platelets 150  - 400 K/uL 196  181  185       CMP     Component Value Date/Time   NA 129 (L) 12/06/2022 1918   K 4.6 12/06/2022 1918   CL 94 (L) 12/06/2022 1918   CO2 26 12/06/2022 1918   GLUCOSE 369 (H) 12/06/2022 1918   BUN 31 (H) 12/26/2022 1330   CREATININE 1.77 (H) 12/26/2022 1330   CREATININE 0.82 09/10/2019 0935   CALCIUM 9.4 12/06/2022 1918   PROT 7.1 10/05/2022 1146   ALBUMIN 3.5 10/05/2022 1146   AST 22 10/05/2022 1146   ALT 18 10/05/2022 1146   ALKPHOS 79 10/05/2022 1146   BILITOT 0.2 (L) 10/05/2022 1146   GFRNONAA 42 (L) 12/26/2022 1330     No results found.     Assessment & Plan:   1. Atherosclerosis of native arteries of the extremities with ulceration (HCC) The patient recently had an angiogram done on his right lower extremity where no intervention was done because it was noted that he had adequate perfusion for wound healing.  No intervention has been done to the left but it is also noted that he has not yet had the ABIs done.  He will continue to follow with podiatry for his wound and will have her return for ABIs to establish his baseline.  I 2. Primary hypertension Continue antihypertensive medications as already ordered, these medications have been reviewed and there are no changes at this time.   Current Outpatient Medications on File Prior to Visit  Medication Sig Dispense Refill   albuterol (VENTOLIN HFA) 108 (90 Base) MCG/ACT inhaler Inhale 2 puffs into the lungs every 4 (four) hours as needed for wheezing or shortness of breath.     amLODipine (NORVASC) 5 MG tablet Take 1 tablet (5 mg total) by mouth daily. 90 tablet 3   aspirin 81 MG tablet Take 81 mg by mouth daily.     atropine 1 % ophthalmic solution Place 1 drop into both eyes 3 (three) times daily.     AUSTEDO 12 MG TABS Take 1 tablet by mouth 2 (two) times daily.     cetirizine (ZYRTEC) 10 MG tablet Take 10 mg by mouth daily.     clonazePAM (KLONOPIN) 1 MG disintegrating tablet Take 1 mg by mouth 2  (two) times daily.     donepezil (ARICEPT) 10 MG tablet Take  1 tablet (10 mg total) by mouth at bedtime. 30 tablet 3   empagliflozin (JARDIANCE) 10 MG TABS tablet Take 10 mg by mouth daily.     levETIRAcetam (KEPPRA) 1000 MG tablet Take 1 tablet (1,000 mg total) by mouth 2 (two) times daily. 60 tablet 3   lisinopril (ZESTRIL) 40 MG tablet Take 40 mg by mouth 2 (two) times daily.     meloxicam (MOBIC) 15 MG tablet Take 15 mg by mouth daily.     metFORMIN (GLUCOPHAGE) 500 MG tablet Take 500 mg by mouth at bedtime. 1,000 mg Q breakfast 500 nightly     mupirocin cream (BACTROBAN) 2 % Apply 1 Application topically 2 (two) times daily.     omeprazole (PRILOSEC) 20 MG capsule TAKE (1) CAPSULE BY MOUTH ONCEDAILY. 30 capsule 2   pravastatin (PRAVACHOL) 10 MG tablet Take 10 mg by mouth daily.     risperiDONE (RISPERDAL) 3 MG tablet Take 1 tablet (3 mg total) by mouth 2 (two) times daily. 60 tablet 0   traZODone (DESYREL) 100 MG tablet Take 1 tablet (100 mg total) by mouth at bedtime. 30 tablet 3   No current facility-administered medications on file prior to visit.    There are no Patient Instructions on file for this visit. No follow-ups on file.   Georgiana Spinner, NP

## 2023-02-26 ENCOUNTER — Other Ambulatory Visit: Payer: Self-pay

## 2023-02-26 ENCOUNTER — Encounter (HOSPITAL_COMMUNITY): Payer: Self-pay | Admitting: *Deleted

## 2023-02-26 ENCOUNTER — Emergency Department (HOSPITAL_COMMUNITY)
Admission: EM | Admit: 2023-02-26 | Discharge: 2023-02-26 | Disposition: A | Discharge status: Home / Self Care | Payer: Medicare Other | Attending: Student | Admitting: Student

## 2023-02-26 ENCOUNTER — Emergency Department (HOSPITAL_COMMUNITY): Payer: Medicare Other

## 2023-02-26 DIAGNOSIS — F0390 Unspecified dementia without behavioral disturbance: Secondary | ICD-10-CM | POA: Insufficient documentation

## 2023-02-26 DIAGNOSIS — E119 Type 2 diabetes mellitus without complications: Secondary | ICD-10-CM | POA: Diagnosis not present

## 2023-02-26 DIAGNOSIS — Z7982 Long term (current) use of aspirin: Secondary | ICD-10-CM | POA: Insufficient documentation

## 2023-02-26 DIAGNOSIS — F17210 Nicotine dependence, cigarettes, uncomplicated: Secondary | ICD-10-CM | POA: Diagnosis not present

## 2023-02-26 DIAGNOSIS — R569 Unspecified convulsions: Secondary | ICD-10-CM | POA: Diagnosis present

## 2023-02-26 DIAGNOSIS — Z79899 Other long term (current) drug therapy: Secondary | ICD-10-CM | POA: Diagnosis not present

## 2023-02-26 DIAGNOSIS — Z7984 Long term (current) use of oral hypoglycemic drugs: Secondary | ICD-10-CM | POA: Insufficient documentation

## 2023-02-26 DIAGNOSIS — I10 Essential (primary) hypertension: Secondary | ICD-10-CM | POA: Diagnosis not present

## 2023-02-26 LAB — CBC WITH DIFFERENTIAL/PLATELET
Abs Immature Granulocytes: 0.04 10*3/uL (ref 0.00–0.07)
Basophils Absolute: 0 10*3/uL (ref 0.0–0.1)
Basophils Relative: 1 %
Eosinophils Absolute: 0.2 10*3/uL (ref 0.0–0.5)
Eosinophils Relative: 3 %
HCT: 41.2 % (ref 39.0–52.0)
Hemoglobin: 13.2 g/dL (ref 13.0–17.0)
Immature Granulocytes: 1 %
Lymphocytes Relative: 18 %
Lymphs Abs: 1 10*3/uL (ref 0.7–4.0)
MCH: 29.2 pg (ref 26.0–34.0)
MCHC: 32 g/dL (ref 30.0–36.0)
MCV: 91.2 fL (ref 80.0–100.0)
Monocytes Absolute: 0.5 10*3/uL (ref 0.1–1.0)
Monocytes Relative: 9 %
Neutro Abs: 4 10*3/uL (ref 1.7–7.7)
Neutrophils Relative %: 68 %
Platelets: 227 10*3/uL (ref 150–400)
RBC: 4.52 MIL/uL (ref 4.22–5.81)
RDW: 13.8 % (ref 11.5–15.5)
WBC: 5.8 10*3/uL (ref 4.0–10.5)
nRBC: 0 % (ref 0.0–0.2)

## 2023-02-26 LAB — COMPREHENSIVE METABOLIC PANEL
ALT: 21 U/L (ref 0–44)
AST: 33 U/L (ref 15–41)
Albumin: 4.4 g/dL (ref 3.5–5.0)
Alkaline Phosphatase: 72 U/L (ref 38–126)
Anion gap: 10 (ref 5–15)
BUN: 13 mg/dL (ref 8–23)
CO2: 23 mmol/L (ref 22–32)
Calcium: 9.9 mg/dL (ref 8.9–10.3)
Chloride: 96 mmol/L — ABNORMAL LOW (ref 98–111)
Creatinine, Ser: 1.21 mg/dL (ref 0.61–1.24)
GFR, Estimated: >60 mL/min (ref >60)
Glucose, Bld: 100 mg/dL — ABNORMAL HIGH (ref 70–99)
Potassium: 4.3 mmol/L (ref 3.5–5.1)
Sodium: 129 mmol/L — ABNORMAL LOW (ref 135–145)
Total Bilirubin: 0.4 mg/dL (ref <1.2)
Total Protein: 8.1 g/dL (ref 6.5–8.1)

## 2023-02-26 LAB — URINALYSIS, ROUTINE W REFLEX MICROSCOPIC
Bacteria, UA: NONE SEEN
Bilirubin Urine: NEGATIVE
Glucose, UA: >=500 mg/dL — AB
Hgb urine dipstick: NEGATIVE
Ketones, ur: NEGATIVE mg/dL
Leukocytes,Ua: NEGATIVE
Nitrite: NEGATIVE
Protein, ur: NEGATIVE mg/dL
Specific Gravity, Urine: 1 — ABNORMAL LOW (ref 1.005–1.030)
pH: 6 (ref 5.0–8.0)

## 2023-02-26 LAB — RAPID URINE DRUG SCREEN, HOSP PERFORMED
Amphetamines: NOT DETECTED
Barbiturates: NOT DETECTED
Benzodiazepines: NOT DETECTED
Cocaine: NOT DETECTED
Opiates: NOT DETECTED
Tetrahydrocannabinol: NOT DETECTED

## 2023-02-26 LAB — CBG MONITORING, ED: Glucose-Capillary: 104 mg/dL — ABNORMAL HIGH (ref 70–99)

## 2023-02-26 MED ORDER — LEVETIRACETAM 500 MG PO TABS
1000.0000 mg | ORAL_TABLET | Freq: Once | ORAL | Status: AC
  Administered 2023-02-26: 1000 mg via ORAL
  Filled 2023-02-26: qty 2

## 2023-02-26 NOTE — ED Provider Notes (Signed)
[place] EMERGENCY DEPARTMENT AT [facility] Provider Note  CSN: [id number] Arrival date & time: 02/26/23 1113  Chief Complaint(s) Seizures  HPI Austin Mcdonald is a 62 y.o. male with PMH dementia, schizophrenia, GERD, tardive dyskinesia, seizure disorder on Keppra who presents emergency department for an evaluation of seizure-like activity.  Arrives from [facility] assisted living where EMS reported that facility staff saw the patient shaking and jerking for an unknown amount of time.  Has been alert and oriented with no evidence of a postictal period for EMS.  Denies current chest pain, shortness of breath, Donnell pain, nausea, vomiting or other systemic symptoms.  States that he believes he has been compliant with his Keppra.   Past Medical History Past Medical History:  Diagnosis Date   Agitation    Dementia (HCC)    Depression    Diabetes (HCC)    Disturbance of salivary secretion    GERD (gastroesophageal reflux disease) 05/09/2016   Hyperlipidemia    Hypertension    Restless    Rhinitis    Schizophrenic disorder (HCC)    Tardive dyskinesia    Patient Active Problem List   Diagnosis Date Noted   Atherosclerosis of native arteries of the extremities with ulceration (HCC) 12/24/2022   Hypertension    Hyperlipidemia    Schizophrenia (HCC) 07/22/2017   Dysphagia 05/09/2017   Diarrhea 05/09/2017   GERD (gastroesophageal reflux disease) 05/09/2016   Diabetes (HCC) 01/12/2015   Depression 01/12/2015   Home Medication(s) Prior to Admission medications   Medication Sig Start Date End Date Taking? Authorizing Provider  albuterol (VENTOLIN HFA) 108 (90 Base) MCG/ACT inhaler Inhale 2 puffs into the lungs every 4 (four) hours as needed for wheezing or shortness of breath.    [provider]  amLODipine (NORVASC) 5 MG tablet Take 1 tablet (5 mg total) by mouth daily. 08/28/20 12/24/23  Pricilla Riffle, MD  aspirin 81 MG tablet Take 81 mg by mouth daily.     [provider]  atropine 1 % ophthalmic solution Place 1 drop into both eyes 3 (three) times daily.    [provider]  AUSTEDO 12 MG TABS Take 1 tablet by mouth 2 (two) times daily.    [provider]  cetirizine (ZYRTEC) 10 MG tablet Take 10 mg by mouth daily.    [provider]  clonazePAM (KLONOPIN) 1 MG disintegrating tablet Take 1 mg by mouth 2 (two) times daily. 11/29/22   [provider]  donepezil (ARICEPT) 10 MG tablet Take 1 tablet (10 mg total) by mouth at bedtime. 09/07/20   Neysa Hotter, MD  empagliflozin (JARDIANCE) 10 MG TABS tablet Take 10 mg by mouth daily.    [provider]  levETIRAcetam (KEPPRA) 1000 MG tablet Take 1 tablet (1,000 mg total) by mouth 2 (two) times daily. 07/21/22   Dione Booze, MD  lisinopril (ZESTRIL) 40 MG tablet Take 40 mg by mouth 2 (two) times daily. 07/17/20   [provider]  meloxicam (MOBIC) 15 MG tablet Take 15 mg by mouth daily.    [provider]  metFORMIN (GLUCOPHAGE) 500 MG tablet Take 500 mg by mouth at bedtime. 1,000 mg Q breakfast 500 nightly    [provider]  mupirocin cream (BACTROBAN) 2 % Apply 1 Application topically 2 (two) times daily.    [provider]  omeprazole (PRILOSEC) 20 MG capsule TAKE (1) CAPSULE BY MOUTH ONCEDAILY. 06/11/21   Dolores Frame, MD  pravastatin (PRAVACHOL) 10 MG  tablet Take 10 mg by mouth daily.    [provider]  risperiDONE (RISPERDAL) 3 MG tablet Take 1 tablet (3 mg total) by mouth 2 (two) times daily. 09/07/20   Neysa Hotter, MD  traZODone (DESYREL) 100 MG tablet Take 1 tablet (100 mg total) by mouth at bedtime. 09/07/20   Neysa Hotter, MD                                                                                                                                    Past Surgical History Past Surgical History:  Procedure Laterality Date   APPENDECTOMY     COLONOSCOPY N/A 02/23/2015   Procedure:  COLONOSCOPY;  Surgeon: Malissa Hippo, MD;  [facility];  Service: Endoscopy;  Laterality: N/A;   ESOPHAGEAL DILATION N/A 02/23/2015   Procedure: ESOPHAGEAL DILATION;  Surgeon: Malissa Hippo, MD;  [facility];  Service: Endoscopy;  Laterality: N/A;   ESOPHAGOGASTRODUODENOSCOPY N/A 02/23/2015   Procedure: ESOPHAGOGASTRODUODENOSCOPY (EGD);  Surgeon: Malissa Hippo, MD;  [facility];  Service: Endoscopy;  Laterality: N/A;  2:00   LOWER EXTREMITY ANGIOGRAPHY Left 12/26/2022   Procedure: Lower Extremity Angiography;  Surgeon: Annice Needy, MD;  Location: [facility];  Service: Cardiovascular;  Laterality: Left;   Family History History reviewed. No pertinent family history.  Social History Social History   Tobacco Use   Smoking status: Every Day    Current packs/day: 0.50    Types: Cigarettes   Smokeless tobacco: Never   Tobacco comments:    10 cigarettes a day  Vaping Use   Vaping status: Never Used  Substance Use Topics   Alcohol use: No    Alcohol/week: 0.0 standard drinks of alcohol   Drug use: No   Allergies Bee pollen  Review of Systems Review of Systems  Neurological:  Positive for seizures.    Physical Exam Vital Signs  I have reviewed the triage vital signs BP 122/74 (BP Location: Left Arm)   Pulse (!) 101   Temp 97.7 F (36.5 C) (Oral)   Resp 12   Ht 6\' 3"  (1.905 m)   Wt 102.5 kg   SpO2 99%   BMI 28.25 kg/m   Physical Exam Constitutional:      General: He is not in acute distress.    Appearance: Normal appearance.  HENT:     Head: Normocephalic and atraumatic.     Nose: No congestion or rhinorrhea.  Eyes:     General:        Right eye: No discharge.        Left eye: No discharge.     Extraocular Movements: Extraocular movements intact.     Pupils: Pupils are equal, round, and reactive to light.  Cardiovascular:     Rate and Rhythm: Normal rate and regular rhythm.     Heart sounds: No murmur  heard. Pulmonary:  Effort: No respiratory distress.     Breath sounds: No wheezing or rales.  Abdominal:     General: There is no distension.     Tenderness: There is no abdominal tenderness.  Musculoskeletal:        General: Normal range of motion.     Cervical back: Normal range of motion.  Skin:    General: Skin is warm and dry.  Neurological:     General: No focal deficit present.     Mental Status: He is alert.     Cranial Nerves: No cranial nerve deficit.     Sensory: No sensory deficit.     Motor: No weakness.     Comments: Slurred speech which is baseline for this patient     ED Results and Treatments Labs (all labs ordered are listed, but only abnormal results are displayed) Labs Reviewed  COMPREHENSIVE METABOLIC PANEL - Abnormal; Notable for the following components:      Result Value   Sodium 129 (*)    Chloride 96 (*)    Glucose, Bld 100 (*)    All other components within normal limits  URINALYSIS, ROUTINE W REFLEX MICROSCOPIC - Abnormal; Notable for the following components:   Color, Urine COLORLESS (*)    Specific Gravity, Urine 1.000 (*)    Glucose, UA >=500 (*)    All other components within normal limits  CBG MONITORING, ED - Abnormal; Notable for the following components:   Glucose-Capillary 104 (*)    All other components within normal limits  CBC WITH DIFFERENTIAL/PLATELET  RAPID URINE DRUG SCREEN, HOSP PERFORMED  LEVETIRACETAM LEVEL                                                                                                                          Radiology CT Head Wo Contrast  Result Date: 02/26/2023 CLINICAL DATA:  Provided history: Seizure disorder, clinical change. EXAM: CT HEAD WITHOUT CONTRAST TECHNIQUE: Contiguous axial images were obtained from the base of the skull through the vertex without intravenous contrast. RADIATION DOSE REDUCTION: This exam was performed according to the departmental dose-optimization program which includes  automated exposure control, adjustment of the mA and/or kV according to patient size and/or use of iterative reconstruction technique. COMPARISON:  Head CT 07/20/2022.  Brain MRI 10/26/2020. FINDINGS: Brain: Generalized cerebral volume loss, mild but greater than expected for age. Cavum septum pellucidum and cavum vergae (common anatomic variant). Mega cisterna magna (common anatomic variant). There is no acute intracranial hemorrhage. No demarcated cortical infarct. No extra-axial fluid collection. No evidence of an intracranial mass. No midline shift. Vascular: No hyperdense vessel.  Atherosclerotic calcifications. Skull: No calvarial fracture or aggressive osseous lesion. Sinuses/Orbits: No mass or acute finding within the imaged orbits. No significant paranasal sinus disease at the imaged levels. IMPRESSION: 1.  No evidence of an acute intracranial abnormality. 2. Generalized cerebral atrophy, mild but greater than expected for age. Electronically Signed   By: Jackey Loge D.O.  On: 02/26/2023 13:08    Pertinent labs & imaging results that were available during my care of the patient were reviewed by me and considered in my medical decision making (see MDM for details).  Medications Ordered in ED Medications  levETIRAcetam (KEPPRA) tablet 1,000 mg (1,000 mg Oral Given 02/26/23 1340)                                                                                                                                     Procedures Procedures  (including critical care time)  Medical Decision Making / ED Course   This patient presents to the ED for concern of seizure like behavior, this involves an extensive number of treatment options, and is a complaint that carries with it a high risk of complications and morbidity.  The differential diagnosis includes medication noncompliance, meningitis, posterior reversible encephalopathy syndrome, hyponatremia, convulsive syncope, focal lesion/mass, head trauma,  intracerebral hemorrhage, toxins/recreational drugs, pseudoseizure  MDM: Patient seen emerged for evaluation of seizure-like behavior.  Physical exam is unremarkable with no focal motor or sensory deficits.  No cranial nerve deficits.  Patient has slurred speech which is baseline for him.  Laboratory evaluation with some mild hyponatremia to 129 similar to previous laboratory evaluations, urinalysis unremarkable, UDS negative and obtained in the setting of seizure behavior.  CT head with no acute findings.  Patient able to ambulate the emergency room without any difficulty and as he has returned to normal mental status baseline and has an overall negative workup he currently does not meet inpatient criteria for admission and will be discharged back to his facility.  He did receive his morning Keppra here in the ER today.  Patient will need to follow-up outpatient with his neurologist.   Additional history obtained:  -External records from outside source obtained and reviewed including: Chart review including previous notes, labs, imaging, consultation notes   Lab Tests: -I ordered, reviewed, and interpreted labs.   The pertinent results include:   Labs Reviewed  COMPREHENSIVE METABOLIC PANEL - Abnormal; Notable for the following components:      Result Value   Sodium 129 (*)    Chloride 96 (*)    Glucose, Bld 100 (*)    All other components within normal limits  URINALYSIS, ROUTINE W REFLEX MICROSCOPIC - Abnormal; Notable for the following components:   Color, Urine COLORLESS (*)    Specific Gravity, Urine 1.000 (*)    Glucose, UA >=500 (*)    All other components within normal limits  CBG MONITORING, ED - Abnormal; Notable for the following components:   Glucose-Capillary 104 (*)    All other components within normal limits  CBC WITH DIFFERENTIAL/PLATELET  RAPID URINE DRUG SCREEN, HOSP PERFORMED  LEVETIRACETAM LEVEL       Imaging Studies ordered: I ordered imaging studies  including CT head I independently visualized and interpreted imaging. I agree with the radiologist interpretation   Medicines  ordered and prescription drug management: Meds ordered this encounter  Medications   levETIRAcetam (KEPPRA) tablet 1,000 mg    -I have reviewed the patients home medicines and have made adjustments as needed  Critical interventions none   Social Determinants of Health:  Factors impacting patients care include: Lives in assisted living   Reevaluation: After the interventions noted above, I reevaluated the patient and found that they have :improved  Co morbidities that complicate the patient evaluation  Past Medical History:  Diagnosis Date   Agitation    Dementia (HCC)    Depression    Diabetes (HCC)    Disturbance of salivary secretion    GERD (gastroesophageal reflux disease) 05/09/2016   Hyperlipidemia    Hypertension    Restless    Rhinitis    Schizophrenic disorder (HCC)    Tardive dyskinesia       Dispostion: I considered admission for this patient, but at this time he does not meet inpatient criteria for admission and will be discharged with outpatient follow-up     Final Clinical Impression(s) / ED Diagnoses Final diagnoses:  Seizure [facility])     @PCDICTATION @    Glendora Score, MD 02/26/23 1433

## 2023-02-26 NOTE — ED Triage Notes (Signed)
Pt brought in by [facility] from [facility] with c/o seizure today. EMS reports the facility said he was shaking and jerking all over for unknown amount of time. EMS reports pt has been alert and oriented the entire time and that he was not post-ictal or did he have loss of bladder/bowel. Pt denies pain. EMS also reports pt has a speech impediment making his speech sound slurred, but that it is normal for him.

## 2023-02-27 LAB — LEVETIRACETAM LEVEL: Levetiracetam Lvl: 27.3 ug/mL (ref 10.0–40.0)

## 2023-03-05 ENCOUNTER — Other Ambulatory Visit (INDEPENDENT_AMBULATORY_CARE_PROVIDER_SITE_OTHER): Payer: Self-pay | Admitting: Nurse Practitioner

## 2023-03-05 DIAGNOSIS — I7025 Atherosclerosis of native arteries of other extremities with ulceration: Secondary | ICD-10-CM

## 2023-03-07 ENCOUNTER — Ambulatory Visit (INDEPENDENT_AMBULATORY_CARE_PROVIDER_SITE_OTHER): Payer: Medicare Other

## 2023-03-07 ENCOUNTER — Encounter (INDEPENDENT_AMBULATORY_CARE_PROVIDER_SITE_OTHER): Payer: Self-pay | Admitting: Vascular Surgery

## 2023-03-07 ENCOUNTER — Ambulatory Visit (INDEPENDENT_AMBULATORY_CARE_PROVIDER_SITE_OTHER): Payer: Medicare Other | Admitting: Vascular Surgery

## 2023-03-07 VITALS — BP 118/74 | HR 90 | Resp 16 | Wt 218.0 lb

## 2023-03-07 DIAGNOSIS — L97509 Non-pressure chronic ulcer of other part of unspecified foot with unspecified severity: Secondary | ICD-10-CM

## 2023-03-07 DIAGNOSIS — I7025 Atherosclerosis of native arteries of other extremities with ulceration: Secondary | ICD-10-CM | POA: Diagnosis not present

## 2023-03-07 DIAGNOSIS — E785 Hyperlipidemia, unspecified: Secondary | ICD-10-CM

## 2023-03-07 DIAGNOSIS — I10 Essential (primary) hypertension: Secondary | ICD-10-CM

## 2023-03-07 DIAGNOSIS — E11621 Type 2 diabetes mellitus with foot ulcer: Secondary | ICD-10-CM

## 2023-03-07 NOTE — Assessment & Plan Note (Signed)
blood glucose control important in reducing the progression of atherosclerotic disease. Also, involved in wound healing. On appropriate medications.  

## 2023-03-07 NOTE — Progress Notes (Signed)
MRN : [id number]  Austin Mcdonald is a 62 y.o. (Sep 21, 1960) male who presents with chief complaint of  Chief Complaint  Patient presents with   Follow-up    6 week abi follow up  .  History of Present Illness: Patient returns today in follow up of his left foot wounds and to recheck his ABIs.  His ABIs today were normal at 1.26 on the right and 1.29 on the left with multiphasic waveforms and normal digital pressures.  He had an angiogram earlier this year showing no focal stenosis to impair wound healing.  His wounds have continued to improve but he still has 2 small roughly centimeter wounds on the left foot on the plantar aspect.  These are granulating well.  He does smoke and he does have diabetes.  Current Outpatient Medications  Medication Sig Dispense Refill   albuterol (VENTOLIN HFA) 108 (90 Base) MCG/ACT inhaler Inhale 2 puffs into the lungs every 4 (four) hours as needed for wheezing or shortness of breath.     amLODipine (NORVASC) 5 MG tablet Take 1 tablet (5 mg total) by mouth daily. 90 tablet 3   aspirin 81 MG tablet Take 81 mg by mouth daily.     atropine 1 % ophthalmic solution Place 1 drop into both eyes 3 (three) times daily.     AUSTEDO 12 MG TABS Take 1 tablet by mouth 2 (two) times daily.     cetirizine (ZYRTEC) 10 MG tablet Take 10 mg by mouth daily.     clonazePAM (KLONOPIN) 1 MG disintegrating tablet Take 1 mg by mouth 2 (two) times daily.     donepezil (ARICEPT) 10 MG tablet Take 1 tablet (10 mg total) by mouth at bedtime. 30 tablet 3   empagliflozin (JARDIANCE) 10 MG TABS tablet Take 10 mg by mouth daily.     levETIRAcetam (KEPPRA) 1000 MG tablet Take 1 tablet (1,000 mg total) by mouth 2 (two) times daily. 60 tablet 3   lisinopril (ZESTRIL) 40 MG tablet Take 40 mg by mouth 2 (two) times daily.     meloxicam (MOBIC) 15 MG tablet Take 15 mg by mouth daily.     metFORMIN (GLUCOPHAGE) 500 MG tablet Take 500 mg by mouth at bedtime. 1,000 mg Q breakfast 500 nightly      mupirocin cream (BACTROBAN) 2 % Apply 1 Application topically 2 (two) times daily.     omeprazole (PRILOSEC) 20 MG capsule TAKE (1) CAPSULE BY MOUTH ONCEDAILY. 30 capsule 2   pravastatin (PRAVACHOL) 10 MG tablet Take 10 mg by mouth daily.     risperiDONE (RISPERDAL) 3 MG tablet Take 1 tablet (3 mg total) by mouth 2 (two) times daily. 60 tablet 0   traZODone (DESYREL) 100 MG tablet Take 1 tablet (100 mg total) by mouth at bedtime. 30 tablet 3   No current facility-administered medications for this visit.    Past Medical History:  Diagnosis Date   Agitation    Dementia (HCC)    Depression    Diabetes (HCC)    Disturbance of salivary secretion    GERD (gastroesophageal reflux disease) 05/09/2016   Hyperlipidemia    Hypertension    Restless    Rhinitis    Schizophrenic disorder (HCC)    Tardive dyskinesia     Past Surgical History:  Procedure Laterality Date   APPENDECTOMY     COLONOSCOPY N/A 02/23/2015   Procedure: COLONOSCOPY;  Surgeon: Malissa Hippo, MD;  [facility];  Service: Endoscopy;  Laterality: N/A;   ESOPHAGEAL DILATION N/A 02/23/2015   Procedure: ESOPHAGEAL DILATION;  Surgeon: Malissa Hippo, MD;  [facility];  Service: Endoscopy;  Laterality: N/A;   ESOPHAGOGASTRODUODENOSCOPY N/A 02/23/2015   Procedure: ESOPHAGOGASTRODUODENOSCOPY (EGD);  Surgeon: Malissa Hippo, MD;  [facility];  Service: Endoscopy;  Laterality: N/A;  2:00   LOWER EXTREMITY ANGIOGRAPHY Left 12/26/2022   Procedure: Lower Extremity Angiography;  Surgeon: Annice Needy, MD;  Location: [facility];  Service: Cardiovascular;  Laterality: Left;     Social History   Tobacco Use   Smoking status: Every Day    Current packs/day: 0.50    Types: Cigarettes   Smokeless tobacco: Never   Tobacco comments:    10 cigarettes a day  Vaping Use   Vaping status: Never Used  Substance Use Topics   Alcohol use: No    Alcohol/week: 0.0 standard drinks of alcohol    Drug use: No       No family history on file.   Allergies  Allergen Reactions   Bee Pollen Other (See Comments)     REVIEW OF SYSTEMS (Negative unless checked)  Constitutional: [] Weight loss  [] Fever  [] Chills Cardiac: [] Chest pain   [] Chest pressure   [] Palpitations   [] Shortness of breath when laying flat   [] Shortness of breath at rest   [] Shortness of breath with exertion. Vascular:  [] Pain in legs with walking   [] Pain in legs at rest   [] Pain in legs when laying flat   [] Claudication   [] Pain in feet when walking  [] Pain in feet at rest  [] Pain in feet when laying flat   [] History of DVT   [] Phlebitis   [x] Swelling in legs   [] Varicose veins   [x] Non-healing ulcers Pulmonary:   [] Uses home oxygen   [] Productive cough   [] Hemoptysis   [] Wheeze  [] COPD   [] Asthma Neurologic:  [] Dizziness  [] Blackouts   [x] Seizures   [] History of stroke   [] History of TIA  [] Aphasia   [] Temporary blindness   [] Dysphagia   [] Weakness or numbness in arms   [] Weakness or numbness in legs Musculoskeletal:  [] Arthritis   [] Joint swelling   [x] Joint pain   [] Low back pain Hematologic:  [] Easy bruising  [] Easy bleeding   [] Hypercoagulable state   [] Anemic   Gastrointestinal:  [] Blood in stool   [] Vomiting blood  [x] Gastroesophageal reflux/heartburn   [] Abdominal pain Genitourinary:  [] Chronic kidney disease   [] Difficult urination  [] Frequent urination  [] Burning with urination   [] Hematuria Skin:  [] Rashes   [x] Ulcers   [x] Wounds Psychological:  [] History of anxiety   []  History of major depression.  Physical Examination  BP 118/74   Pulse 90   Resp 16   Wt 218 lb (98.9 kg)   BMI 27.25 kg/m  Gen:  WD/WN, NAD appears older than stated age. Head: [place]/AT, No temporalis wasting. Ear/Nose/Throat: Hearing grossly intact, dentition is very poor Eyes: Conjunctiva clear. Sclera non-icteric Neck: Supple.  Trachea midline Pulmonary:  Good air movement, no use of accessory muscles.  Cardiac: RRR, no  JVD Vascular:  Vessel Right Left  Radial Palpable Palpable                          PT Not palpable Not palpable  DP 1+ palpable 1+ palpable   Gastrointestinal: soft, non-tender/non-distended. No guarding/reflex.  Musculoskeletal: M/S 5/5 throughout.  No deformity or atrophy.  Thick scaling hyperpigmentation is present.  2 roughly 1 cm ulcerations are present on the plantar aspect of the left foot with good granulation tissue.  1-2+ bilateral lower extremity edema. Neurologic: Sensation grossly intact in extremities.  Symmetrical.  Speech is fluent.  Psychiatric: Judgment intact, Mood & affect appropriate for pt's clinical situation. Dermatologic: foot wounds as described above      Labs Recent Results (from the past 2160 hours)  BUN     Status: Abnormal   Collection Time: 12/26/22  1:30 PM  Result Value Ref Range   BUN 31 (H) 8 - 23 mg/dL    Comment: Performed at [facility], [address] [place], [place] [address]  Creatinine, serum     Status: Abnormal   Collection Time: 12/26/22  1:30 PM  Result Value Ref Range   Creatinine, Ser 1.77 (H) 0.61 - 1.24 mg/dL   GFR, Estimated 42 (L) >60 mL/min    Comment: (NOTE) Calculated using the CKD-EPI Creatinine Equation (2021) Performed at [facility], [address] [place], [place] [address]   Glucose, capillary     Status: Abnormal   Collection Time: 12/26/22  1:30 PM  Result Value Ref Range   Glucose-Capillary 285 (H) 70 - 99 mg/dL    Comment: Glucose reference range applies only to samples taken after fasting for at least 8 hours.  CBG monitoring, ED     Status: Abnormal   Collection Time: 02/26/23 12:21 PM  Result Value Ref Range   Glucose-Capillary 104 (H) 70 - 99 mg/dL    Comment: Glucose reference range applies only to samples taken after fasting for at least 8 hours.  Urinalysis, Routine w reflex microscopic -Urine, Clean Catch     Status: Abnormal   Collection Time: 02/26/23 12:28 PM   Result Value Ref Range   Color, Urine COLORLESS (A) YELLOW   APPearance CLEAR CLEAR   Specific Gravity, Urine 1.000 (L) 1.005 - 1.030   pH 6.0 5.0 - 8.0   Glucose, UA >=500 (A) NEGATIVE mg/dL   Hgb urine dipstick NEGATIVE NEGATIVE   Bilirubin Urine NEGATIVE NEGATIVE   Ketones, ur NEGATIVE NEGATIVE mg/dL   Protein, ur NEGATIVE NEGATIVE mg/dL   Nitrite NEGATIVE NEGATIVE   Leukocytes,Ua NEGATIVE NEGATIVE   RBC / HPF 0-5 0 - 5 RBC/hpf   WBC, UA 0-5 0 - 5 WBC/hpf   Bacteria, UA NONE SEEN NONE SEEN   Squamous Epithelial / HPF 0-5 0 - 5 /HPF    Comment: Performed at [facility], [address], [place], [place] [address]  Rapid urine drug screen (hospital performed)     Status: None   Collection Time: 02/26/23 12:28 PM  Result Value Ref Range   Opiates NONE DETECTED NONE DETECTED   Cocaine NONE DETECTED NONE DETECTED   Benzodiazepines NONE DETECTED NONE DETECTED   Amphetamines NONE DETECTED NONE DETECTED   Tetrahydrocannabinol NONE DETECTED NONE DETECTED   Barbiturates NONE DETECTED NONE DETECTED    Comment: (NOTE) DRUG SCREEN FOR MEDICAL PURPOSES ONLY.  IF CONFIRMATION IS NEEDED FOR ANY PURPOSE, NOTIFY LAB WITHIN 5 DAYS.  LOWEST DETECTABLE LIMITS FOR URINE DRUG SCREEN Drug Class                     Cutoff (ng/mL) Amphetamine and metabolites    1000 Barbiturate and metabolites    200 Benzodiazepine                 200 Opiates and metabolites        300  Cocaine and metabolites        300 THC                            50 Performed at [facility], [address], [place], [place] [address]   Comprehensive metabolic panel     Status: Abnormal   Collection Time: 02/26/23 12:31 PM  Result Value Ref Range   Sodium 129 (L) 135 - 145 mmol/L   Potassium 4.3 3.5 - 5.1 mmol/L   Chloride 96 (L) 98 - 111 mmol/L   CO2 23 22 - 32 mmol/L   Glucose, Bld 100 (H) 70 - 99 mg/dL    Comment: Glucose reference range applies only to samples taken after fasting for at least 8 hours.   BUN 13 8  - 23 mg/dL   Creatinine, Ser 4.09 0.61 - 1.24 mg/dL   Calcium 9.9 8.9 - 81.1 mg/dL   Total Protein 8.1 6.5 - 8.1 g/dL   Albumin 4.4 3.5 - 5.0 g/dL   AST 33 15 - 41 U/L   ALT 21 0 - 44 U/L   Alkaline Phosphatase 72 38 - 126 U/L   Total Bilirubin 0.4 <1.2 mg/dL   GFR, Estimated >91 >47 mL/min    Comment: (NOTE) Calculated using the CKD-EPI Creatinine Equation (2021)    Anion gap 10 5 - 15    Comment: Performed at [facility], [address] [place], [place] [address]  CBC with Differential     Status: None   Collection Time: 02/26/23 12:31 PM  Result Value Ref Range   WBC 5.8 4.0 - 10.5 K/uL   RBC 4.52 4.22 - 5.81 MIL/uL   Hemoglobin 13.2 13.0 - 17.0 g/dL   HCT 21.3 08.6 - 57.8 %   MCV 91.2 80.0 - 100.0 fL   MCH 29.2 26.0 - 34.0 pg   MCHC 32.0 30.0 - 36.0 g/dL   RDW 46.9 62.9 - 52.8 %   Platelets 227 150 - 400 K/uL   nRBC 0.0 0.0 - 0.2 %   Neutrophils Relative % 68 %   Neutro Abs 4.0 1.7 - 7.7 K/uL   Lymphocytes Relative 18 %   Lymphs Abs 1.0 0.7 - 4.0 K/uL   Monocytes Relative 9 %   Monocytes Absolute 0.5 0.1 - 1.0 K/uL   Eosinophils Relative 3 %   Eosinophils Absolute 0.2 0.0 - 0.5 K/uL   Basophils Relative 1 %   Basophils Absolute 0.0 0.0 - 0.1 K/uL   Immature Granulocytes 1 %   Abs Immature Granulocytes 0.04 0.00 - 0.07 K/uL    Comment: Performed at [facility], [address] [place], [place] [address]  Levetiracetam level     Status: None   Collection Time: 02/26/23 12:31 PM  Result Value Ref Range   Levetiracetam Lvl 27.3 10.0 - 40.0 ug/mL    Comment: (NOTE) Performed At: [facility] [address] [place], [place] [id number] Jolene Schimke MD [id number]     Radiology CT Head Wo Contrast Result Date: 02/26/2023 CLINICAL DATA:  Provided history: Seizure disorder, clinical change. EXAM: CT HEAD WITHOUT CONTRAST TECHNIQUE: Contiguous axial images were obtained from the base of the skull through the vertex without intravenous contrast. RADIATION  DOSE REDUCTION: This exam was performed according to the departmental dose-optimization program which includes automated exposure control, adjustment of the mA and/or kV according to patient size and/or use of iterative reconstruction technique. COMPARISON:  Head CT 07/20/2022.  Brain  MRI 10/26/2020. FINDINGS: Brain: Generalized cerebral volume loss, mild but greater than expected for age. Cavum septum pellucidum and cavum vergae (common anatomic variant). Mega cisterna magna (common anatomic variant). There is no acute intracranial hemorrhage. No demarcated cortical infarct. No extra-axial fluid collection. No evidence of an intracranial mass. No midline shift. Vascular: No hyperdense vessel.  Atherosclerotic calcifications. Skull: No calvarial fracture or aggressive osseous lesion. Sinuses/Orbits: No mass or acute finding within the imaged orbits. No significant paranasal sinus disease at the imaged levels. IMPRESSION: 1.  No evidence of an acute intracranial abnormality. 2. Generalized cerebral atrophy, mild but greater than expected for age. Electronically Signed   By: Jackey Loge D.O.   On: 02/26/2023 13:08    Assessment/Plan  Atherosclerosis of native arteries of the extremities with ulceration (HCC) His ABIs today were normal at 1.26 on the right and 1.29 on the left with multiphasic waveforms and normal digital pressures.   Wounds are healing reasonably well although quite slowly.  No role for vascularization or invasive evaluation at this point.  Follow-up in 3 months.  Hypertension blood pressure control important in reducing the progression of atherosclerotic disease. On appropriate oral medications.   Diabetes (HCC) blood glucose control important in reducing the progression of atherosclerotic disease. Also, involved in wound healing. On appropriate medications.   Hyperlipidemia lipid control important in reducing the progression of atherosclerotic disease. Continue statin  therapy    Festus Barren, MD  03/07/2023 12:01 PM    This note was created with Dragon medical transcription system.  Any errors from dictation are purely unintentional

## 2023-03-07 NOTE — Assessment & Plan Note (Signed)
lipid control important in reducing the progression of atherosclerotic disease. Continue statin therapy  

## 2023-03-07 NOTE — Assessment & Plan Note (Signed)
His ABIs today were normal at 1.26 on the right and 1.29 on the left with multiphasic waveforms and normal digital pressures.   Wounds are healing reasonably well although quite slowly.  No role for vascularization or invasive evaluation at this point.  Follow-up in 3 months.

## 2023-03-07 NOTE — Assessment & Plan Note (Signed)
blood pressure control important in reducing the progression of atherosclerotic disease. On appropriate oral medications.  

## 2023-06-10 ENCOUNTER — Ambulatory Visit (INDEPENDENT_AMBULATORY_CARE_PROVIDER_SITE_OTHER): Payer: Medicare Other | Admitting: Vascular Surgery

## 2023-06-10 VITALS — BP 104/70 | HR 76 | Resp 16 | Wt 219.6 lb

## 2023-06-10 DIAGNOSIS — I10 Essential (primary) hypertension: Secondary | ICD-10-CM

## 2023-06-10 DIAGNOSIS — I7025 Atherosclerosis of native arteries of other extremities with ulceration: Secondary | ICD-10-CM | POA: Diagnosis not present

## 2023-06-10 DIAGNOSIS — E11621 Type 2 diabetes mellitus with foot ulcer: Secondary | ICD-10-CM | POA: Diagnosis not present

## 2023-06-10 DIAGNOSIS — L97509 Non-pressure chronic ulcer of other part of unspecified foot with unspecified severity: Secondary | ICD-10-CM

## 2023-06-10 DIAGNOSIS — E785 Hyperlipidemia, unspecified: Secondary | ICD-10-CM

## 2023-06-10 NOTE — Assessment & Plan Note (Signed)
 Ulcers have now healed.  Perfusion is intact.  Can follow-up as needed at this point.

## 2023-06-10 NOTE — Progress Notes (Signed)
 MRN : [id number]  Austin Mcdonald is a 63 y.o. (04-09-1960) male who presents with chief complaint of  Chief Complaint  Patient presents with   Follow-up    3 month follow up  .  History of Present Illness: Patient returns today in follow up of his lower extremity wounds.  He has had an angiogram and ABIs in the past 6 months that showed good flow distally for wound healing.  His left foot wounds have gone on to heal.  He still has complaints of pain in both legs.  He has schizophrenia and very poor historian.  A member of his group home comes with him today and provides much of the history.  Current Outpatient Medications  Medication Sig Dispense Refill   albuterol (VENTOLIN HFA) 108 (90 Base) MCG/ACT inhaler Inhale 2 puffs into the lungs every 4 (four) hours as needed for wheezing or shortness of breath.     amLODipine (NORVASC) 5 MG tablet Take 1 tablet (5 mg total) by mouth daily. 90 tablet 3   aspirin 81 MG tablet Take 81 mg by mouth daily.     atropine 1 % ophthalmic solution Place 1 drop into both eyes 3 (three) times daily.     AUSTEDO 12 MG TABS Take 1 tablet by mouth 2 (two) times daily.     cetirizine (ZYRTEC) 10 MG tablet Take 10 mg by mouth daily.     clonazePAM (KLONOPIN) 1 MG disintegrating tablet Take 1 mg by mouth 2 (two) times daily.     donepezil (ARICEPT) 10 MG tablet Take 1 tablet (10 mg total) by mouth at bedtime. 30 tablet 3   empagliflozin (JARDIANCE) 10 MG TABS tablet Take 10 mg by mouth daily.     levETIRAcetam (KEPPRA) 1000 MG tablet Take 1 tablet (1,000 mg total) by mouth 2 (two) times daily. 60 tablet 3   lisinopril (ZESTRIL) 40 MG tablet Take 40 mg by mouth 2 (two) times daily.     meloxicam (MOBIC) 15 MG tablet Take 15 mg by mouth daily.     metFORMIN (GLUCOPHAGE) 500 MG tablet Take 500 mg by mouth at bedtime. 1,000 mg Q breakfast 500 nightly     mupirocin cream (BACTROBAN) 2 % Apply 1 Application topically 2 (two) times daily.     omeprazole (PRILOSEC)  20 MG capsule TAKE (1) CAPSULE BY MOUTH ONCEDAILY. 30 capsule 2   pravastatin (PRAVACHOL) 10 MG tablet Take 10 mg by mouth daily.     risperiDONE (RISPERDAL) 3 MG tablet Take 1 tablet (3 mg total) by mouth 2 (two) times daily. 60 tablet 0   traZODone (DESYREL) 100 MG tablet Take 1 tablet (100 mg total) by mouth at bedtime. 30 tablet 3   No current facility-administered medications for this visit.    Past Medical History:  Diagnosis Date   Agitation    Dementia (HCC)    Depression    Diabetes (HCC)    Disturbance of salivary secretion    GERD (gastroesophageal reflux disease) 05/09/2016   Hyperlipidemia    Hypertension    Restless    Rhinitis    Schizophrenic disorder (HCC)    Tardive dyskinesia     Past Surgical History:  Procedure Laterality Date   APPENDECTOMY     COLONOSCOPY N/A 02/23/2015   Procedure: COLONOSCOPY;  Surgeon: Malissa Hippo, MD;  [facility];  Service: Endoscopy;  Laterality: N/A;   ESOPHAGEAL DILATION N/A 02/23/2015   Procedure: ESOPHAGEAL DILATION;  Surgeon: Ok Anis  Cline Crock, MD;  [facility];  Service: Endoscopy;  Laterality: N/A;   ESOPHAGOGASTRODUODENOSCOPY N/A 02/23/2015   Procedure: ESOPHAGOGASTRODUODENOSCOPY (EGD);  Surgeon: Malissa Hippo, MD;  [facility];  Service: Endoscopy;  Laterality: N/A;  2:00   LOWER EXTREMITY ANGIOGRAPHY Left 12/26/2022   Procedure: Lower Extremity Angiography;  Surgeon: Annice Needy, MD;  Location: [facility];  Service: Cardiovascular;  Laterality: Left;     Social History   Tobacco Use   Smoking status: Every Day    Current packs/day: 0.50    Types: Cigarettes   Smokeless tobacco: Never   Tobacco comments:    10 cigarettes a day  Vaping Use   Vaping status: Never Used  Substance Use Topics   Alcohol use: No    Alcohol/week: 0.0 standard drinks of alcohol   Drug use: No      No family history on file.   Allergies  Allergen Reactions   Bee Pollen Other (See  Comments)    REVIEW OF SYSTEMS (Negative unless checked)   Constitutional: [] Weight loss  [] Fever  [] Chills Cardiac: [] Chest pain   [] Chest pressure   [] Palpitations   [] Shortness of breath when laying flat   [] Shortness of breath at rest   [] Shortness of breath with exertion. Vascular:  [] Pain in legs with walking   [] Pain in legs at rest   [] Pain in legs when laying flat   [] Claudication   [] Pain in feet when walking  [] Pain in feet at rest  [] Pain in feet when laying flat   [] History of DVT   [] Phlebitis   [x] Swelling in legs   [] Varicose veins   [x] Non-healing ulcers Pulmonary:   [] Uses home oxygen   [] Productive cough   [] Hemoptysis   [] Wheeze  [] COPD   [] Asthma Neurologic:  [] Dizziness  [] Blackouts   [x] Seizures   [] History of stroke   [] History of TIA  [] Aphasia   [] Temporary blindness   [] Dysphagia   [] Weakness or numbness in arms   [] Weakness or numbness in legs Musculoskeletal:  [] Arthritis   [] Joint swelling   [x] Joint pain   [] Low back pain Hematologic:  [] Easy bruising  [] Easy bleeding   [] Hypercoagulable state   [] Anemic   Gastrointestinal:  [] Blood in stool   [] Vomiting blood  [x] Gastroesophageal reflux/heartburn   [] Abdominal pain Genitourinary:  [] Chronic kidney disease   [] Difficult urination  [] Frequent urination  [] Burning with urination   [] Hematuria Skin:  [] Rashes   [x] Ulcers   [x] Wounds Psychological:  [] History of anxiety   []  History of major depression.     Physical Examination  BP 104/70   Pulse 76   Resp 16   Wt 219 lb 9.6 oz (99.6 kg)   BMI 27.45 kg/m  Gen:  WD/WN, NAD Head: [place]/AT, No temporalis wasting. Ear/Nose/Throat: Hearing grossly intact, dentition poor.  Drooling present Eyes: Conjunctiva clear. Sclera non-icteric Neck: Supple.  Trachea midline Pulmonary:  Good air movement, no use of accessory muscles.  Cardiac: RRR, no JVD Vascular:  Vessel Right Left  Radial Palpable Palpable                          PT 1+ Palpable 1+ Palpable  DP 1+  Palpable 1+ Palpable    Musculoskeletal: M/S 5/5 throughout.  No deformity or atrophy.  Previous wounds have healed.  Thick scaling hyperpigmentation is present.  1+ bilateral lower extremity edema. Neurologic: Sensation grossly intact in extremities.  Symmetrical.  Speech is  difficult to understand. Psychiatric: Judgment and insight are very poor.  Patient is a poor historian. Dermatologic: No rashes or ulcers noted.  No cellulitis or open wounds.      Labs No results found for this or any previous visit (from the past 2160 hours).  Radiology No results found.  Assessment/Plan  Atherosclerosis of native arteries of the extremities with ulceration (HCC) Ulcers have now healed.  Perfusion is intact.  Can follow-up as needed at this point.  Hypertension blood pressure control important in reducing the progression of atherosclerotic disease. On appropriate oral medications.     Diabetes (HCC) blood glucose control important in reducing the progression of atherosclerotic disease. Also, involved in wound healing. On appropriate medications.     Hyperlipidemia lipid control important in reducing the progression of atherosclerotic disease. Continue statin therapy  Festus Barren, MD  06/10/2023 12:28 PM    This note was created with Dragon medical transcription system.  Any errors from dictation are purely unintentional

## 2023-11-21 ENCOUNTER — Ambulatory Visit: Admitting: Podiatry
